# Patient Record
Sex: Female | Born: 1965 | Race: Black or African American | Hispanic: No | Smoking: Never smoker
Health system: Southern US, Community
[De-identification: ages and names within clinical notes are randomized; demographics above are authoritative.]

## PROBLEM LIST (undated history)

## (undated) ENCOUNTER — Ambulatory Visit (HOSPITAL_COMMUNITY): Source: Home / Self Care

## (undated) DIAGNOSIS — K219 Gastro-esophageal reflux disease without esophagitis: Secondary | ICD-10-CM

## (undated) DIAGNOSIS — R011 Cardiac murmur, unspecified: Secondary | ICD-10-CM

## (undated) DIAGNOSIS — T7840XA Allergy, unspecified, initial encounter: Secondary | ICD-10-CM

## (undated) DIAGNOSIS — D649 Anemia, unspecified: Secondary | ICD-10-CM

## (undated) HISTORY — DX: Gastro-esophageal reflux disease without esophagitis: K21.9

## (undated) HISTORY — DX: Cardiac murmur, unspecified: R01.1

## (undated) HISTORY — DX: Anemia, unspecified: D64.9

## (undated) HISTORY — PX: ABDOMINAL HYSTERECTOMY: SHX81

## (undated) HISTORY — PX: MYOMECTOMY: SHX85

## (undated) HISTORY — DX: Allergy, unspecified, initial encounter: T78.40XA

## (undated) HISTORY — PX: SEPTOPLASTY: SUR1290

## (undated) HISTORY — PX: UTERINE FIBROID SURGERY: SHX826

---

## 1998-05-01 ENCOUNTER — Ambulatory Visit (HOSPITAL_COMMUNITY): Admission: RE | Admit: 1998-05-01 | Discharge: 1998-05-01 | Payer: Self-pay | Admitting: *Deleted

## 1999-04-16 ENCOUNTER — Emergency Department (HOSPITAL_COMMUNITY): Admission: EM | Admit: 1999-04-16 | Discharge: 1999-04-16 | Payer: Self-pay | Admitting: Emergency Medicine

## 1999-07-03 ENCOUNTER — Other Ambulatory Visit: Admission: RE | Admit: 1999-07-03 | Discharge: 1999-07-03 | Payer: Self-pay | Admitting: Obstetrics and Gynecology

## 2000-09-20 ENCOUNTER — Other Ambulatory Visit: Admission: RE | Admit: 2000-09-20 | Discharge: 2000-09-20 | Payer: Self-pay | Admitting: *Deleted

## 2001-06-07 ENCOUNTER — Ambulatory Visit (HOSPITAL_COMMUNITY): Admission: RE | Admit: 2001-06-07 | Discharge: 2001-06-07 | Payer: Self-pay | Admitting: Obstetrics and Gynecology

## 2001-06-07 ENCOUNTER — Encounter: Payer: Self-pay | Admitting: Obstetrics and Gynecology

## 2001-07-05 ENCOUNTER — Encounter (INDEPENDENT_AMBULATORY_CARE_PROVIDER_SITE_OTHER): Payer: Self-pay

## 2001-07-05 ENCOUNTER — Inpatient Hospital Stay (HOSPITAL_COMMUNITY): Admission: RE | Admit: 2001-07-05 | Discharge: 2001-07-07 | Payer: Self-pay | Admitting: Obstetrics and Gynecology

## 2002-06-04 ENCOUNTER — Other Ambulatory Visit: Admission: RE | Admit: 2002-06-04 | Discharge: 2002-06-04 | Payer: Self-pay | Admitting: Obstetrics and Gynecology

## 2002-10-10 ENCOUNTER — Encounter (INDEPENDENT_AMBULATORY_CARE_PROVIDER_SITE_OTHER): Payer: Self-pay

## 2002-10-10 ENCOUNTER — Ambulatory Visit (HOSPITAL_COMMUNITY): Admission: RE | Admit: 2002-10-10 | Discharge: 2002-10-10 | Payer: Self-pay | Admitting: Obstetrics and Gynecology

## 2003-03-30 ENCOUNTER — Ambulatory Visit (HOSPITAL_COMMUNITY): Admission: RE | Admit: 2003-03-30 | Discharge: 2003-03-30 | Payer: Self-pay | Admitting: *Deleted

## 2003-03-30 ENCOUNTER — Encounter: Payer: Self-pay | Admitting: *Deleted

## 2003-06-25 ENCOUNTER — Other Ambulatory Visit: Admission: RE | Admit: 2003-06-25 | Discharge: 2003-06-25 | Payer: Self-pay | Admitting: Gynecology

## 2004-08-12 ENCOUNTER — Other Ambulatory Visit: Admission: RE | Admit: 2004-08-12 | Discharge: 2004-08-12 | Payer: Self-pay | Admitting: Gynecology

## 2004-11-06 ENCOUNTER — Observation Stay (HOSPITAL_COMMUNITY): Admission: RE | Admit: 2004-11-06 | Discharge: 2004-11-07 | Payer: Self-pay | Admitting: Gynecology

## 2004-12-11 ENCOUNTER — Encounter (INDEPENDENT_AMBULATORY_CARE_PROVIDER_SITE_OTHER): Payer: Self-pay | Admitting: *Deleted

## 2004-12-11 ENCOUNTER — Inpatient Hospital Stay (HOSPITAL_COMMUNITY): Admission: RE | Admit: 2004-12-11 | Discharge: 2004-12-14 | Payer: Self-pay | Admitting: Gynecology

## 2006-05-27 ENCOUNTER — Ambulatory Visit: Payer: Self-pay | Admitting: Gynecology

## 2006-12-26 ENCOUNTER — Ambulatory Visit: Payer: Self-pay | Admitting: Gynecology

## 2007-03-20 ENCOUNTER — Ambulatory Visit: Payer: Self-pay | Admitting: Gynecology

## 2007-09-21 ENCOUNTER — Ambulatory Visit: Payer: Self-pay | Admitting: Nurse Practitioner

## 2007-11-01 ENCOUNTER — Ambulatory Visit: Payer: Self-pay | Admitting: Gynecology

## 2008-01-30 ENCOUNTER — Ambulatory Visit: Payer: Self-pay | Admitting: Gynecology

## 2008-02-02 ENCOUNTER — Ambulatory Visit: Payer: Self-pay | Admitting: Gynecology

## 2010-07-02 ENCOUNTER — Encounter: Admission: RE | Admit: 2010-07-02 | Discharge: 2010-07-02 | Payer: Self-pay | Admitting: Obstetrics & Gynecology

## 2011-04-16 NOTE — Op Note (Signed)
[place] [facility]  Patient:    Renee Webb, Renee Webb                      MRN: [id number] Proc. Date: 07/05/01 Attending:  Cordelia Pen A. Rosalio Macadamia, M.D.                           Operative Report  PREOPERATIVE DIAGNOSES:       1. Menorrhagia.                               2. Fibroid uterus.                               3. Pelvic adhesions.  POSTOPERATIVE DIAGNOSES:      1. Menorrhagia.                               2. Fibroid uterus.                               3. Pelvic adhesions.  PROCEDURE:                    1. Multiple myomectomy.                               2. Lysis of adhesions.  SURGEON:                      Sherry A. Rosalio Macadamia, M.D.  ASSISTANT:                    Sung Amabile. Roslyn Smiling, M.D.  ANESTHESIA:                   General.  ESTIMATED BLOOD LOSS:         450 cc.  COMPLICATIONS:                None.  INDICATIONS:                  This is a 45 year old, G0, P0, woman who has very heavy and irregular bleeding for several years. The patients bleeding has gotten significantly worse recently. At the same time, the patient complained of left lower quadrant pain. The patient has not used any contraception recently but had been on birth control pills before without any improvement in her bleeding. The patient has been treated with Prometrium q.month to help with her bleeding, which improved slightly but not completely. The patient was followed with ultrasounds with multiple fibroids present. A hysterosalpingogram had been performed prior to surgery which showed at least one submucosal fibroid, but otherwise pretty normal fallopian tubes. There were adhesions in the cul-de-sac of the abdomen and around the fallopian tubes, which was felt to be consistent with some adhesions. Because of all of this, the patient was brought to the operating room for myomectomy and lysis of adhesions.  FINDINGS:                     An 77- to 12-week size,  irregularly-shaped uterus with multiple fibroids present. Approximately 15 fibroids removed of varying sizes. Normal fallopian  tubes bilaterally. Ovaries were normal, however, they were both stuck to the sidewalls of the pelvis with very fine adhesions surrounding the ovaries. Normal appendix.  DESCRIPTION OF PROCEDURE:     The patient was brought into the operating room and given adequate general anesthesia. She was placed in a dorsal lithotomy position. Her abdomen and pelvis were washed with Betadine. A Foley catheter was inserted into the bladder. Speculum was placed in the vagina. The anterior lip of the cervix was grasped with a single-tooth tenaculum. A pediatric Foley was inserted into the endometrial cavity. The balloon was inflated. The vagina was packed with three 4 x 18 Betadine-soaked sponges. Speculum and tenaculum were removed. The patient was taken out of the frog leg position. She was draped in a sterile fashion. A Pfannenstiel incision was made and brought down sharply through the fascia. The fascia was incised sharply. The fascia was elevated off of the rectus muscles sharply. The rectus muscles were separated bluntly. The peritoneum was identified and entered sharply. This incision was extended superiorly and inferiorly. Balfour retractor was placed in the abdominal cavity. Bowel was packed off with wrapped sponges. The uterus was inspected with multiple fibroids present. The fibroids were removed by injecting first with Pitressin, making an incision with the needle tip Bovie, and the fibroids were dissected with blunt and sharp dissection as well as cautery dissection. Through each incision, as many fibroids were removed as possible. Approximately four different incisions were made throughout the uterus to be able to remove all the fibroids. Each incision was closed deeply with 0 Vicryl in figure-of-eight stitches in multiple layers, and each serosal incision was  closed with 2-0 Monocryl in baseball-type stitches. There were two pedunculated fibroids that were removed by dissecting them free from the serosa and the serosa was just closed with the 2-0 Monocryl. One fibroid was left in place. This was just beneath the left cornual region. It was felt that if this fibroid was removed, some damage to the fallopian tube and cornua might ensue. All of the fibroids were removed that could be palpated. The indigo carmine dye had been injected through the cervix, however, at no time could any free spill be seen through the fallopian tubes. At no time was the endometrial cavity breached; however, because of the nature of the multiple incisions throughout the uterus, it is our recommendation that should this pat become pregnant, she should have a cesarean section in the future.  Attention was turned to the ovaries. The ovaries had been stuck to the sidewalls. These were dissected free with just blunt dissection, and the surface of each ovary was cauterized and all the adhesions were lysed carefully by using the [employer] unit. Any bleeders were cauterized by using the microscopic bipolar cautery. The area was irrigated during all of this dissection. The fallopian tubes were felt to be normal. The pelvis was felt to be free after this dissection had been performed. The incision was irrigated with large amounts of warm saline. The packed sponges were removed. Using Interceed, this was placed across the anterior wall of the uterus to protect the incisions to keep any adhesions from forming. The large colon was placed against the uterus to prevent any small obstruction. The appendix was felt to be normal and it was placed under the cecum above the uterus and to the right in the normal position. Adequate hemostasis was present. All packs and instruments were removed from the abdomen. The fascia was then closed with two #0  Vicryl sutures running laterally to the  midline. The incision was irrigated. The bleeders were cauterized. The skin was infiltrated with 0.25% Marcaine. The skin incision was closed with 4-0 Monocryl in subcuticular  running stitch. Sterile bandage was placed over the wound. The packs and pediatric Foley were removed from the vagina. The patient was taken out of the frog leg position. She was then moved from the operating table to a stretcher in stable condition. DD:  07/05/01 TD:  07/06/01 Job: [id number]

## 2011-04-16 NOTE — Discharge Summary (Signed)
NAMEBENJAMIN, Renee Webb             ACCOUNT NO.:  [id number]   MEDICAL RECORD NO.:  [id number]          PATIENT TYPE:  INP   LOCATION:  9320                          FACILITY:  [facility]   PHYSICIAN:  Ginger Carne, MD  DATE OF BIRTH:  1966/03/07   DATE OF ADMISSION:  12/11/2004  DATE OF DISCHARGE:  12/13/2004                                 DISCHARGE SUMMARY   FINAL DIAGNOSIS:  A 14 to 16-week leiomyomatous uterus.   PROCEDURE:  Myomectomy converted to total abdominal hysterectomy with  preservation of both tubes and ovaries and placement of ureteral stents  bilaterally.   HOSPITAL COURSE:  This is a 45 year old African American female with a  complicated history prior to admission secondary to multiple procedures  related to leiomyomatous uterus at other centers.  At the time of surgery,  the patient and husband understood that if the uterus would not be capable  of satisfactory closure due to significant leiomyomata being removed and to  take care of persistent left lower quadrant pain related to fibroids,  hysterectomy will be performed.  At the time of surgery, it was evident that  the uterus could not be preserved satisfactorily due to the above criteria.  The patient's husband was notified intraoperatively, and a total abdominal  hysterectomy was therefore performed.   The patient's postoperative course was uneventful.  She was afebrile, voided  well, and her incision was dry.  Calfs were without tenderness, and her  lungs were clear.  Routine postoperative instructions were provided to said  patient.  She will be rechecked in four days to have her subcuticular suture  removed.  Percocet 5/235 one to two every four to six hours provided to said  patient.  Postoperative instructions also included concerns related to depression and  the possibility of contacting the office should she feel this way following  her surgery.   Postoperative hemoglobin was 8.3 from a preoperative of  10.7.     Stev   SHB/MEDQ  D:  12/13/2004  T:  12/13/2004  Job:  [id number]

## 2011-04-16 NOTE — Op Note (Signed)
NAME:  Renee Webb, Renee Webb                       ACCOUNT NO.:  [id number]   MEDICAL RECORD NO.:  [id number]                   PATIENT TYPE:  AMB   LOCATION:  SDC                                  FACILITY:  [facility]   PHYSICIAN:  Dois Davenport A. Rivard, M.D.              DATE OF BIRTH:  05-11-1966   DATE OF PROCEDURE:  10/10/2002  DATE OF DISCHARGE:                                 OPERATIVE REPORT   PREOPERATIVE DIAGNOSES:  1. Left lower quadrant pain.  2. Infertility.  3. Fibroids.   POSTOPERATIVE DIAGNOSES:  1. Pelvic adhesions.  2. Left ovarian cyst.  3. Endometriosis.  4. Fibroids.  5. Bilateral tubal patency.   PROCEDURE:  Laparoscopy, lysis of adhesions, left ovarian cystectomy,  chromopertubation.   SURGEON:  Crist Fat. Rivard, M.D.   ANESTHESIA:  General.   ESTIMATED BLOOD LOSS:  Minimal.   DESCRIPTION OF PROCEDURE:  After being informed of the planned procedure  with possible complications including bleeding, infection, injury to other  organs such as bladder, bowels,potassium or ureters and the need for  laparotomy.  Informed consent was obtained.  The patient was taken to OR #2,  given general anesthesia and endotracheal intubation and placed in a  lithotomy position.  She was prepped and draped in a sterile fashion.  A  Foley catheter was inserted in her bladder, and an acorn manipulator was  inserted in her uterus for chromopertubation.  We proceeded with  infiltration of the umbilical area using 8 cc of Marcaine 0.25%, and  performed a semi-elliptical incision using knives.  This was bluntly brought  down to the fascia which was grasped with two Kocher forceps and incised  with Mayo scissors.  A 0 Vicryl suture was placed on each angle of the  fascia.  The peritoneum was then entered bluntly.  A Hasson trocar was  placed and held in place with the two previously placed 0 Vicryl sutures.  We proceeded with insufflation of the pneumoperitoneum using CO2 at a  maximum  pressure of 15 mmHg and inserted the laparoscope mounted on the  video monitor.   Observation:  The anterior cul-de-sac shows fine adhesions between the  bladder and the uterus as well as 1 cm brown plaque compatible with  endometriosis.  The uterus is enlarged and has a left cornual subserosal  fibroid measuring 3-4 cm and overall volume is compatible with 8-10 weeks  pregnancy.  The posterior cul-de-sac is partially obliterated with fine  adhesions pulling on the sigmoid colon.  The left adnexa is completely  adherent to the left pelvic wall due to a significant adhesion process.  The  right adnexa is adherent to the right pelvic wall again with significant  adhesions.  It is impossible to fully evaluate tubes at this point due to  the distortion of the anatomy.  The appendix is visualized and normal.  The  liver is visualized and normal.  No  other pathology is seen within the  abdominal pelvic cavity.   We proceeded with the insertion of a 5 mm trocar in the left lower quadrant  and a 10 mm trocar in the suprapubic area overlooking the previous  Pfannenstiel incisions after infiltration with an extra 10 cc of Marcaine  0.25%.  This allows [place] systemic complete dissection of all adhesions using  scissors and tripolar.  We are now able to completely free the left ovary  and the left tube as well as the right ovary and the right tube and the  bladder flap.  The endometriotic lesion previously described is also  cauterized.  When both ovaries are freed we can see that there is a small 2-  3 cm cyst on the left ovary which spontaneously ruptures during  adhesiolysis.  It leaks clear fluid.  Part of the ovarian cyst wall is  removed and sent for pathology.  We then completely cauterized the capsule  of the cyst.  We abundantly irrigated with warm saline,a nondistended  complete hemostasis on the uterine surface with cautery.  Hemostasis was  then felt to be adequate.  We proceeded with  chromopertubation which reveals  bilateral tubal patency.  We again irrigate profusely with warm saline, note  a satisfactory hemostasis.  We placed a full sheet of Interceed over the  left ovary and left tube.  The two extra trocars are then removed under  direct visualize to appreciate complete hemostasis.  The laparoscope is  removed.  Pneumoperitoneum is evacuated.  We then closed the fascia of both  10 mm incisions using simple sutures of 0 Vicryl.  Skin is closed with 4-0  Vicryl.  Instrument and sponge counts were complete x2.  Estimated blood  loss is minimal.  The procedure was very well tolerated by the patient who  was taken to the recovery room in a well and stable condition.                                               Crist Fat Rivard, M.D.    SAR/MEDQ  D:  10/10/2002  T:  10/10/2002  Job:  [id number]

## 2011-04-16 NOTE — Discharge Summary (Signed)
[facility]  Patient:    Renee Webb, Renee Webb Visit Number: [id number] MRN: [id number]          Service Type: GYN Location: [id number] Attending Physician:  Morene Antu Dictated by:   Sherry A. Rosalio Macadamia, M.D. Admit Date:  07/05/2001 Discharge Date: 07/07/2001                             Discharge Summary  PROBLEM #1:                   Menorrhagia, fibroid uterus.  SUBJECTIVE:                   The patient is a 45 year old, G0, P0, woman who has excessively heavy menstrual flow. The patient has had known fibroids for several years. The patient had a hysterosalpingogram performed prior to surgery which revealed loculation of some dye in her cul-de-sac. The patient had an ultrasound showing multiple fibroids. Because of this, the patient is brought to the operating room for exploratory laparotomy with myomectomy.  OBJECTIVE:  PHYSICAL EXAMINATION:         HEENT within normal limits. Neck without any lymphadenopathy. Thyroid without nodule. Chest clear to auscultation. Heart regular rhythm without murmur. Breasts without mass. CVA nontender. Abdomen soft, nontender, without mass. External genitalia within normal limits. Cervix within normal limits. Uterus anteflexed, irregular, 10- to 11-week size. Adnexa without mass.  HOSPITAL COURSE:              The patient was admitted and brought to the operating room where multiple myomectomies and lysis of adhesions were performed.  Findings included a 12-week size uterus with multiple fibroids present ranging in size from 1 cm to 5 cm. Approximately 15 were removed. Normal tubes, normal fimbria. Ovaries with filmy adhesions across the surface and stuck to the sidewalls. Normal appendix.  Postoperatively, the patient did well. She remained afebrile. Her vital signs remained stable. The patients Tmax was 100.1. The patients preoperative hematocrit was 34.1 and on her first postoperative day  was 27.7. Urinalysis was within normal limits preoperatively. The patient was discharged to home on her second postoperative day.  ASSESSMENT:                   Stable, status post multiple myomectomy and lysis of adhesions.  PLAN:                         The patient is to follow up in the office in two to three weeks. The patient will call if she has heavy bleeding, fevers, or severe pain.  DISCHARGE MEDICATIONS:        The patient is discharged home on Darvocet-N 100, #30.Dictated by:   Sherry A. Rosalio Macadamia, M.D. Attending Physician:  Morene Antu DD:  08/06/01 TD:  08/06/01 Job: [id number]

## 2011-04-16 NOTE — H&P (Signed)
Renee Webb, Renee Webb             ACCOUNT NO.:  [id number]   MEDICAL RECORD NO.:  [id number]          PATIENT TYPE:  OBV   LOCATION:  NA                            [facility]AC[facility]:  [facility]   PHYSICIAN:  Ginger Carne, MD  DATE OF BIRTH:  12/10/1965   DATE OF ADMISSION:  11/05/2004  DATE OF DISCHARGE:                                HISTORY & PHYSICAL   ADMISSION DIAGNOSES:  1.  Pelvic adhesions.  2.  Leiomyomata of uterus.  3.  Genuine urinary stress incontinence.   PROCEDURE:  1.  Tension-free vaginal tape procedure with polypropylene and cystoscopy.  2.  Operative laparoscopy.  3.  Operative hysteroscopy with chromotubation.   HISTORY OF PRESENT ILLNESS:  This patient is a 45 year old, gravida 0, para  0, African American female with a longstanding history of infertility,  leiomyomata, and urodynamic stress incontinence.   The patient had been followed by a gynecologist in [place] with long-term  infertility complaints.  Her menses have been regular about every 28 to 30  days.  She has been diagnosed over the years with leiomyomata and had  undergone a laparoscopic myomectomy in 2003, proceeded by a laparotomy for  leiomyomata removal in 2002.  In 2004, she had a laparotomy for adhesion of  lysis.   The patient had undergone in July of 2004 a histosalpingogram at  [facility] which concluded large filling defects within the  endometrial cavity suspicious for submucosal fibroids measuring about 3.5 to  4 cm in dimension.  At that time of her initial visit to this office, the  patient had been seeking a second opinion because of said genuine urinary  stress incontinence but also because of concerns related to multiple  myomectomies and her inability to conceive.  Her husband to date has not had  a semen analysis.  However, on most recent history of salpingogram in 2004,  both fallopian tubes appeared to be within normal limits with bilateral  intraperitoneal  spillage.   The patient states that she does have discomfort with menses and does have  complaints of dyspareunia from time to time.  She does not have specific  pelvic pain.   Symptoms of genuine urinary stress incontinence includes loss of urine with  straining, coughing, other Valsalva maneuvers.  She denies increased  frequency, nocturia, urgency, dysuria, and pressure.  She does have to  strain to void.  Does not have an intermittent stream.  She has not had  previous urological surgery or recurrent pyelonephritis/urinary tract  infections.  She has not had previous bladder surgery and denies fecal  incontinence.  She takes no medications to enhance her loss of urine and has  no known medical diseases that would increase her propensity for urine loss.   OBSTETRICAL HISTORY:  The patient has never conceived.   ALLERGIES:  DYE and TRIAMINIC COUGH SYRUP.   MEDICATIONS:  Allegra D and Singulair.   PAST MEDICAL HISTORY:  Asthma.   FAMILY HISTORY:  The patient's mother is deceased from colon cancer.  Her  father has hypertension.  No other family first degree relatives with  breast,  ovary, or uterine carcinoma.   SOCIAL HISTORY:  Negative for smoking, illicit drug abuse, or alcohol abuse.   PAST SURGICAL HISTORY:  Per above.   REVIEW OF SYMPTOMS:  Negative.   PHYSICAL EXAMINATION:  VITAL SIGNS:  Height 5 foot 11 inches.  Blood  pressure 128/60.  Weight 180 pounds.  HEENT:  Grossly normal.  BREASTS:  Without masses, discharge, thickening, or tenderness.  CHEST:  Clear to auscultation and percussion.  CARDIOVASCULAR:  Without murmurs or enlargements.  Regular rate and rhythm.  EXTREMITIES:  All within normal limits.  LYMPHATICS:  All within normal limits.  SKIN:  All within normal limits.  NEUROLOGICAL:  All within normal limits.  MUSCULOSKELETAL:  All within normal limits.  ABDOMEN:  Soft without gross hepatosplenomegaly.  PELVIC:  Normal Pap smear.  External genitalia,  vulva, and vagina normal.  Cervix smooth without erosions or lesions.  Uterus is multilobular about 8  to 10 weeks in size, semimobile consistent with leiomyomatous uterus.  Both  adnexal are palpable and found to be normal.  RECTAL:  Hemoccult negative without masses.   LABORATORY DATA:  Urinalysis is normal.  Residual urine volume is 26 cc.  Perineal reflex is normal.  S2/3/4 reflexes normal.  No specific evidence  for cystocele, rectocele, or antrocele.  Anal sphincter tone is normal.  The  patient has a Q-tip angle of 40 degrees without hypermobility.   IMPRESSION:  1.  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  2.  Genuine urinary stress incontinence.   PLAN:  Long-term infertility:  I discussed with Mr. and Mrs. Tripp that  what is needed at this time is careful evaluation and assessment of her  pelvis and uterus in so far as infertility is concerned.  She is need in of  an operative hysteroscopy and chromotubation to analyze and evaluate and  treat leiomyoma.  It was carefully discussed with said couple that on  balance most evidence indicates that pregnancy and implantation rates are  significantly lower in women with submucosal leiomyoma; however, in those  with subserosal or intramural myoma that do not encroach or significantly  distort the endometrial cavity, it is unlikely that these lesions will have  any significant impact on fertility rates.  Also, the patient needs to be  evaluated as far as adhesive disease in her pelvis.  It is unclear from a  previous operative record as to what extent the adhesions are effecting her  adnexa.   The patient has attempted Kegel exercises in the past without benefit.  She  has no evidence for an overactive bladder. The nature of said procedure  discussed in detail, specifically risks including postoperative urinary retention, urgency, urinary infection, bleeding, wound infection of the  vaginal site, and  possible recurrent urinary incontinence.  The latter was  discussed in detail as to her possibilities of conception in the future.  It  was recommended that an elective cesarean section be performed in the event  of a full term pregnancy.  The nature of both procedures discussed in  detail.  All questions answered.  She is scheduled for said surgery on  Friday, November 06, 2004.     Stev   SHB/MEDQ  D:  11/02/2004  T:  11/02/2004  Job:  [id number]

## 2011-04-16 NOTE — Discharge Summary (Signed)
NAMEMALI, EPPARD             ACCOUNT NO.:  [id number]   MEDICAL RECORD NO.:  [id number]          PATIENT TYPE:  OBV   LOCATION:  9305                          [facility]ILITY[facility]   PHYSICIAN:  Ginger Carne, MD  DATE OF BIRTH:  1966-02-08   DATE OF ADMISSION:  11/06/2004  DATE OF DISCHARGE:                                 DISCHARGE SUMMARY   FINAL DIAGNOSES:  Fourteen week leiomyomatous uterus and urodynamic stress  incontinence.   HOSPITAL PROCEDURES:  Diagnostic laparoscopy, chromotubation, tension-free  vaginal tape procedure and cystoscopy with hysteroscopy.   HOSPITAL COURSE:  This patient is a 45 year old [place] American female who  underwent the aforementioned procedures on November 06, 2004.  Specifically,  the patient was noted to have a 14-week leiomyomatous uterus with tubes that  did not show a bilateral spillage of dye.  The patient was able to void on  discharge.  Cystoscopy was normal immediately after surgery.  She was  discharged with routine postoperative instructions.   DISCHARGE MEDICATIONS:  Percocet 5/325 1-2 q.4-6h. and Augmentin 500 mg  b.i.d.   DISCHARGE INSTRUCTIONS:  She was instructed to have timed voids over the  next 1-2 weeks.  Will contact the office for difficulties with voiding,  temperature elevation above 100.4 degrees Fahrenheit, increasing vaginal  discharge or bleeding or any other concerns.  The patient was asked to avoid  constipation.   FOLLOWUP:  Will be seen in the office in 2 weeks time.     Stev   SHB/MEDQ  D:  11/07/2004  T:  11/07/2004  Job:  [id number]

## 2011-04-16 NOTE — H&P (Signed)
NAMEARNELLE, NALE             ACCOUNT NO.:  [id number]   MEDICAL RECORD NO.:  [id number]          PATIENT TYPE:  INP   LOCATION:  NA                            [facility]   PHYSICIAN:  Ginger Carne, MD  DATE OF BIRTH:  25-Jul-1966   DATE OF ADMISSION:  DATE OF DISCHARGE:                                HISTORY & PHYSICAL   ADMISSION DIAGNOSIS:  Uterine leiomyoma.   PROCEDURE:  Myomectomy, possible total abdominal hysterectomy with  preservation of both tubes and ovaries and placement of ureteral stents with  cystoscopy.   HISTORY OF PRESENT ILLNESS:  This patient is a 45 year old African American,  gravida 0, para 0 female with a longstanding history of infertility,  leiomyoma and pelvic pain.  The patient had been seen in the past by several  gynecologists in [place], [place], with long-term infertility  complaints.  Her menses are regular, about every 28 to 30 days.  Over the  years, she has been diagnosed with leiomyoma which had been removed via  laparoscopic myomectomy in 2003 followed by laparotomy for said lesions in  2002.  In 2004, she had another laparotomy for lysis of adhesions.  In July  of 2004, hysterosalpingogram at [facility] indicated large  filling defects within the endometrial cavity suspicious for submucosal  fibroids measuring 3.5 to 4 cm.  At the time of her visit to this office  initially, the patient had sought a second opinion and subsequently  underwent, on November 06, 2004, a diagnostic laparoscopy with  chromopertubation, hysteroscopy and tension free vaginal tape.  Procedure  was cystoscopy for infertility and for genuine urinary stress incontinence,  respectively.  At the time of this procedure, there were multiple intramural  and subserosal as well as submucosal leiomyoma.  Many were located in the  left lateral aspect of her uterus where her pain is principally located.  There was no evidence of dye spillage  bilaterally despite multiple attempts  at chromopertubation with methylene blue dye.  The pelvis did not  demonstrate evidence for endometriosis.  The remainder of the upper abdomen,  large and small-bowel were grossly normal.  Total uterine size was measured  to be approximately 14 cm.   The patient states that she does have discomfort with her menses and between  her menses as well as dyspareunia.  She does complain of left lower quadrant  pain as well.   OBSTETRIC/GYNECOLOGIC HISTORY:  The patient has never conceived.   ALLERGIES:  DYE to [facility] COUGH SYRUP.   MEDICATIONS:  Allegra and Singulair.   PAST MEDICAL HISTORY:  Asthma.   FAMILY HISTORY:  The patient's mother is deceased from colon cancer.  Her  father has hypertension.  No other first degree relatives with breast,  colon, ovarian or uterine carcinoma.   SOCIAL HISTORY:  Negative for smoking, illicit drug abuse or alcohol abuse.   PAST SURGICAL HISTORY:  Per above.   REVIEW OF SYMPTOMS:  Negative.   PHYSICAL EXAMINATION:  GENERAL APPEARANCE:  A pleasant female in no acute  distress.  VITAL SIGNS:  Weight 180 pounds, blood pressure  120/70, height 5 feet 11  inches.  HEENT:  Grossly normal.  CHEST:  Clear.  LUNGS:  Clear to percussion and auscultation.  CARDIOVASCULAR:  Without murmurs or enlargements.  BREASTS:  Without masses, discharge, thickening or tenderness.  VASCULAR/EXTREMITIES/LYMPHATICS/SKIN/NEUROLOGIC/MUSCULOSKELETAL:  All within  normal limits.  ABDOMEN:  A 14-week size lower abdominal mass consistent with leiomyoma.  PELVIC:  Normal Pap smear.  External genitalia, vulva and vagina normal.  Cervix smooth without erosions or lesions.  Uterus is 14 weeks in size,  multinodular.  Both adnexa are palpable and are filled with masses  consistent with leiomyoma.  RECTAL:  Hemoccult negative without masses.   IMPRESSION:  A 14-week leiomyomatous uterus with concerns of infertility as  well as pelvic  pain.   PLAN:  I discussed at length with the patient the options of having to deal  with a reoperated pelvis and the possibility of further adhesion formation.  In particular, concerns regarding the possibility of having a significantly  deformed uterus may result in a total abdominal hysterectomy after removing  said fibroids.  The patient's pain on her left side according to her and her  husband are significant and both parties feel that the pain should take full  priority over infertility.  I also explained to them that performing a  myomectomy does not assure fertility.  In addition, the tubes will undergo  chromopertubation at the time of said myomectomy to see if patency can be  accomplished.  Risks including hemorrhage possibly requiring blood  transfusion, infection, postoperative adhesion formation and possible  hysterectomy resulting from increased blood loss and/or significant  deformity of the uterus were discussed in detail with the patient.  Natures  and procedures also discussed.     Stev   SHB/MEDQ  D:  12/08/2004  T:  12/08/2004  Job:  [id number]

## 2011-04-16 NOTE — Discharge Summary (Signed)
NAMEANDRIENNE, HAVENER             ACCOUNT NO.:  [id number]   MEDICAL RECORD NO.:  [id number]          PATIENT TYPE:  INP   LOCATION:  9320                          [facility]:  [facility]   PHYSICIAN:  Ginger Carne, MD  DATE OF BIRTH:  01/27/1966   DATE OF ADMISSION:  12/11/2004  DATE OF DISCHARGE:  12/14/2004                                 DISCHARGE SUMMARY   ADDENDUM   Following discharge instructions and discharge orders on the morning of  December 13, 2004, the patient did not have a bowel movement despite being  placed on two Bisacodyl 10 mg tablets along with a Dulcolax suppository.  On  evaluation in the late afternoon, her abdomen was slightly distended with  minimal to no bowel sounds and concern regarding the possibility of ileus  was entertained.  She was placed NPO and intravenous hydration will be  started and the patient was observed.  Her potassium was 3.2 and hemoglobin  7.9.  The patient had a bowel movement on the morning of discharge.  Bowel  sounds were adequate.  Abdomen was soft and the patient felt subjectively  better.  She was discharged with encouragement to have oranges, increase her  potassium level and make sure she keeps up with her fluids and does not eat  heavy red meats for the next week.  Otherwise, routine postoperative  instructions as described in my previous dictation.     Stev   SHB/MEDQ  D:  12/14/2004  T:  12/14/2004  Job:  [id number]

## 2011-04-16 NOTE — Op Note (Signed)
[name], Webb             ACCOUNT NO.:  [id number]   MEDICAL RECORD NO.:  [id number]          PATIENT TYPE:  OBV   LOCATION:  9399                          [facility]  [facility]   PHYSICIAN:  Ginger Carne, MD  DATE OF BIRTH:  [birth year]   DATE OF PROCEDURE:  11/06/2004  DATE OF DISCHARGE:                                 OPERATIVE REPORT   PREOPERATIVE DIAGNOSES:  1.  Pelvic adhesions.  2.  Leiomyomatous uterus.  3.  Genuine urinary stress incontinence.   POSTOPERATIVE DIAGNOSES:  1.  A  14-week leiomyomatous uterus.  2.  Genuine urinary stress incontinence.   PROCEDURE:  1.  Diagnostic laparoscopy.  2.  Chromotubation.  3.  Hysteroscopy.  4.  Tension-free vaginal tape procedure with cystoscopy.   SURGEON:  Ginger Carne, M.D.   ASSISTANT:  None.   COMPLICATIONS:  None immediate.   ESTIMATED BLOOD LOSS:  250 mL.   SPECIMENS:  None.   OPERATIVE FINDINGS:  External genitalia, vulva, and vagina are normal.  Cervix is smooth without erosions or lesions.  Hysteroscopic evaluation  revealed a normal endocervical canal.  Advancement of hysteroscope revealed  multiple submucosal leiomyomata with significant distortion of the uterine  cavity.  The uterus sounded to 14 cm.  Laparoscopic evaluation demonstrated  multiple intramural and subserosal leiomyoma.  Many of these were located in  the fundus and on the left lateral and right lateral sides of the uterus.  There was no evidence of dye spillage bilaterally despite multiple attempts  and chromotubation with methylene blue dye.  Pelvis demonstrated no evidence  of endometriosis.  Upper abdomen appeared normal.  Large and small bowel  normal.  No evidence of femoral, inguinal, obturator hernias.   OPERATIVE PROCEDURE:  The patient prepped and draped in the usual fashion  and placed in the lithotomy position.  Betadine solution used for antiseptic  and the patient was catheterized prior to the procedure.  After  adequate  general anesthesia, a vertical and infra-umbilical incision was made and the  Veress needle placed in the abdomen.  The opening and closing pressures were  10 and 15 mmHg.  The needle was released and a trocar was placed in the same  incision.  The laparoscopic placed through the sleeve.  One left lower  quadrant port 5 mm was made in the left lower quadrant.  This was performed  under direct visualization.  Following this, inspection of the pelvic  contents was followed by photography and this was the followed by  chromotubation as described above.  Afterwards, gas released, trocars  removed.  Closure of the 10 mm fascial site with 0 Vicryl suture and 4-0  Vicryl for subcuticular closure.  Afterwards a hysteroscopic evaluation was  performed by grasping the cervix with a single-tooth tenaculum.  Dilatation  to accommodate a hysteroscope with normal saline was then followed by  completion of said procedure without difficulties.  At this point, the  tension-free vaginal tape procedure was performed.   Copious irrigation of the vaginal with lactated Ringer's followed.  The sub-  epithelial layer of the anterior vagina epithelium  was injected with  Marcaine and 0.5% epinephrine.  This was then followed by a 4 cm incision on  the anterior vaginal epithelium midline.  The pubovesical cervical fascia  was dissected bilaterally to the point of the space of Retzius bilaterally.  At this point, the tension-free vaginal tape system with polypropylene and  bottom up trocars were used.  The trocars exited the lower abdominal region  hugging the back of the pubic bone bilaterally at the level of the pubic  tubercles.  Cystoscopy revealed no injury to the bladder with filling to the  dome.  Sidewalls, the ureteral orifices identified and trigone was  identified.  Afterwards the bladder was emptied.  Appropriate tensioning to  250 mL of normal saline in the bladder was performed.  At this  point, the  plastic sheaths were removed from the TVT system and the TVT was cut  appropriately at the level of the lower abdominal skin region.  Bleeding  points were hemostatically set with copious irrigation and lactated  followed.  Closure of the vaginal epithelium with 0 Monocryl, running,  interlocking suture.  The patient tolerated the procedure well and was  transported to anesthesia recovery room in excellent condition.     Stev   SHB/MEDQ  D:  11/06/2004  T:  11/07/2004  Job:  [id number]

## 2011-04-16 NOTE — Op Note (Signed)
Renee Webb, Renee Webb             ACCOUNT NO.:  [id number]   MEDICAL RECORD NO.:  [id number]          PATIENT TYPE:  INP   LOCATION:  9320                          [facility]   PHYSICIAN:  Ginger Carne, MD  DATE OF BIRTH:  1966/11/11   DATE OF PROCEDURE:  12/11/2004  DATE OF DISCHARGE:                                 OPERATIVE REPORT   PREOPERATIVE DIAGNOSIS:  Fourteen to sixteen-week leiomyomatous uterus.   POSTOPERATIVE DIAGNOSIS:  Fourteen to sixteen-week leiomyomatous uterus.   PROCEDURES:  1.  Myomectomy conversion to total abdominal hysterectomy.  2.  Cystoscopy with placement of ureteral stents.   SURGEON:  Ginger Carne, MD   ASSISTANT:  None.   COMPLICATIONS:  None immediate.   ESTIMATED BLOOD LOSS:  350 mL.   SPECIMENS:  Uterus and cervix.   ANESTHESIA:  General.   OPERATIVE FINDINGS:  The uterus was about 16 weeks in size and had multiple  leiomyoma, subserosal and intramural, areas of concentrated both anteriorly,  posteriorly, and laterally.  Many of these measured 3-4 cm, and others were  small between 8 mm and 2 mm.  During the course of the dissection, it was  obvious that despite all best efforts, the intrauterine cavity was entered  anteriorly for approximately 7-8 cm with added fibroids that needed to be  removed to further distort the uterus.  There were also fibroids on the left  side at the isthmus.  Discussions with the husband and brother and patient  prior to surgery addressed the concerns that the patient's primary issues of  pain were overriding and took priority over fertility.  It was felt that  with further dissection, the uterus would not be capable of suitable chances  for fertility and that said lesions also invaded the ostia of said tubes  bilaterally as well.  For this reason, it was deemed that to have left  additional fibroids anteriorly and posteriorly on the left side where most  of her pain was concentrated would not  be appropriate.  Both tubes and  ovaries appeared to be normal.  They were somewhat adherent by scar tissue  to the lateral aspects of the uterus.  The right tube was adherent to the  isthmus.  The ureters were identified bilaterally with the aid of stents.  Number 6 stents were used.  The bladder appeared normal.   OPERATIVE PROCEDURE:  Patient prepped and draped in the usual fashion and  placed in lithotomy position, Betadine solution used for antiseptic, and the  patient was catheterized prior to the procedure.  After adequate general  anesthesia, a cystoscopy was performed by placing a 25-degree cystoscope  with normal saline in the bladder through the urethra.  The ureteral  orifices were catheterized without incident with #6 French catheters.  Following this, a Pfannenstiel incision was made and the abdomen opened.  A  self-retaining retractor was utilized.  The dilute solution of Pitressin was  also used, 20 units in 100 mL of normal saline, around leiomyoma.  Bleeding  was not a major problem during the attempt at myomectomy.  When it  was  determined that the uterus had been significantly distorted and all myoma  had not been removed, the patient's husband was notified by telephone in the  operating room.  The case was discussed, and it was agreed to perform a  total abdominal hysterectomy with preservation of both tubes and ovaries.  The round ligaments were clamped, cut and ligated with 0 Vicryl suture.  The  utero-ovarian ligaments were clamped, cut and ligated with 0 Vicryl suture.  Anterior and posterior peritoneal leaves dissected, and the uterine  vasculature was clamped, cut and ligated with 0 Vicryl suture.  At the  junction of the vagina and the cervix, the uterus and the cervix were  removed and the cuff closed with 0 Vicryl running interlocking suture.  Bleeding points hemostatically checked, blood clots removed from the  abdomen.  Copious irrigation with lactated  Ringer's followed.  Closure of  the parietal peritoneum with 2-0 Vicryl running suture, 0 Vicryl for the  fascia, 3-0 Vicryl for the subcutaneous layer, and 4-0 Prolene for  subcuticular closure.  Instrument and sponge count were correct.  The  patient tolerated the procedure well, returned to the postanesthesia  recovery room in excellent condition.     Stev   SHB/MEDQ  D:  12/11/2004  T:  12/11/2004  Job:  [id number]

## 2013-04-21 ENCOUNTER — Ambulatory Visit: Payer: Self-pay | Admitting: Physician Assistant

## 2013-04-21 VITALS — BP 117/77 | HR 66 | Temp 97.6°F | Resp 18 | Ht 69.0 in | Wt 185.0 lb

## 2013-04-21 DIAGNOSIS — K6289 Other specified diseases of anus and rectum: Secondary | ICD-10-CM

## 2013-04-21 DIAGNOSIS — J302 Other seasonal allergic rhinitis: Secondary | ICD-10-CM

## 2013-04-21 DIAGNOSIS — K645 Perianal venous thrombosis: Secondary | ICD-10-CM

## 2013-04-21 NOTE — Progress Notes (Signed)
   [address] [place] [address]   Phone [phone]  Subjective:    Patient ID: Renee Webb, female    DOB: March 13, 1966, 47 y.o.   MRN: [id number]  HPI Pt presents to clinic with concerns that she has hemorrhoids.  She started having pain in her anal area on Wed and it has gotten worse.  It hurts to sit and stand.  She has been using tucks pads and they are not helping much.  She has never had hemorrhoids before that she knows of.  She has been seeing blood on the toilet tissue.  She has not recently been constipated.   Review of Systems  Gastrointestinal: Positive for diarrhea (about 2 wks ago) and anal bleeding (on toilet tissue). Negative for constipation and blood in stool.       Objective:   Physical Exam  Vitals reviewed. Constitutional: She is oriented to person, place, and time. She appears well-developed and well-nourished.  HENT:  Head: Normocephalic and atraumatic.  Right Ear: External ear normal.  Left Ear: External ear normal.  Pulmonary/Chest: Effort normal.  Abdominal: Soft.  Genitourinary: Rectal exam shows external hemorrhoid (2 large (about 1.5cm each) thrombosed hemorrhoids - ).  Neurological: She is alert and oriented to person, place, and time.  Skin: Skin is warm and dry.  Psychiatric: She has a normal mood and affect. Her behavior is normal. Judgment and thought content normal.    Procedure:  Consent obtained.  Local anesthesia obtained with 1% lido with epi.  Curved iris scissors used to make an elliptical incision into each thrombosed hemorrhoid.  Multiple blood clots from both were removed.  Pt got immediate relief.      Assessment & Plan:  Thrombosed hemorrhoids- treated with enucleation of blood clots.  Pt to do warm sitz baths and make sure the area is clean after bowel movements.  Anal pain  Benny Lennert PA-C 04/21/2013 1:14 PM

## 2013-04-21 NOTE — Patient Instructions (Addendum)
Hemorrhoids -- thrombosed hemorrhoid (up to date- website) Hemorrhoids are swollen veins around the rectum or anus. There are two types of hemorrhoids:   Internal hemorrhoids. These occur in the veins just inside the rectum. They may poke through to the outside and become irritated and painful.  External hemorrhoids. These occur in the veins outside the anus and can be felt as a painful swelling or hard lump near the anus. CAUSES  Pregnancy.   Obesity.   Constipation or diarrhea.   Straining to have a bowel movement.   Sitting for long periods on the toilet.  Heavy lifting or other activity that caused you to strain.  Anal intercourse. SYMPTOMS   Pain.   Anal itching or irritation.   Rectal bleeding.   Fecal leakage.   Anal swelling.   One or more lumps around the anus.  DIAGNOSIS  Your caregiver may be able to diagnose hemorrhoids by visual examination. Other examinations or tests that may be performed include:   Examination of the rectal area with a gloved hand (digital rectal exam).   Examination of anal canal using a small tube (scope).   A blood test if you have lost a significant amount of blood.  A test to look inside the colon (sigmoidoscopy or colonoscopy). TREATMENT Most hemorrhoids can be treated at home. However, if symptoms do not seem to be getting better or if you have a lot of rectal bleeding, your caregiver may perform a procedure to help make the hemorrhoids get smaller or remove them completely. Possible treatments include:   Placing a rubber band at the base of the hemorrhoid to cut off the circulation (rubber band ligation).   Injecting a chemical to shrink the hemorrhoid (sclerotherapy).   Using a tool to burn the hemorrhoid (infrared light therapy).   Surgically removing the hemorrhoid (hemorrhoidectomy).   Stapling the hemorrhoid to block blood flow to the tissue (hemorrhoid stapling).  HOME CARE INSTRUCTIONS   Eat  foods with fiber, such as whole grains, beans, nuts, fruits, and vegetables. Ask your doctor about taking products with added fiber in them (fibersupplements).  Increase fluid intake. Drink enough water and fluids to keep your urine clear or pale yellow.   Exercise regularly.   Go to the bathroom when you have the urge to have a bowel movement. Do not wait.   Avoid straining to have bowel movements.   Keep the anal area dry and clean. Use wet toilet paper or moist towelettes after a bowel movement.   Medicated creams and suppositories may be used or applied as directed.   Only take over-the-counter or prescription medicines as directed by your caregiver.   Take warm sitz baths for 15 20 minutes, 3 4 times a day to ease pain and discomfort.   Place ice packs on the hemorrhoids if they are tender and swollen. Using ice packs between sitz baths may be helpful.   Put ice in a plastic bag.   Place a towel between your skin and the bag.   Leave the ice on for 15 20 minutes, 3 4 times a day.   Do not use a donut-shaped pillow or sit on the toilet for long periods. This increases blood pooling and pain.  SEEK MEDICAL CARE IF:  You have increasing pain and swelling that is not controlled by treatment or medicine.  You have uncontrolled bleeding.  You have difficulty or you are unable to have a bowel movement.  You have pain or inflammation outside the  area of the hemorrhoids. MAKE SURE YOU:  Understand these instructions.  Will watch your condition.  Will get help right away if you are not doing well or get worse. Document Released: 11/12/2000 Document Revised: 11/01/2012 Document Reviewed: 09/19/2012 [facility] Patient Information 2014 [place], [place].

## 2013-11-28 ENCOUNTER — Emergency Department (HOSPITAL_COMMUNITY)
Admission: EM | Admit: 2013-11-28 | Discharge: 2013-11-28 | Disposition: A | Discharge status: Home / Self Care | Payer: Self-pay | Source: Home / Self Care

## 2015-05-12 ENCOUNTER — Other Ambulatory Visit: Payer: Self-pay | Admitting: Gynecology

## 2015-05-14 LAB — CYTOLOGY - PAP

## 2015-12-16 ENCOUNTER — Telehealth: Payer: Self-pay | Admitting: Internal Medicine

## 2015-12-16 ENCOUNTER — Encounter: Payer: Self-pay | Admitting: Gastroenterology

## 2015-12-16 NOTE — Telephone Encounter (Signed)
Spoke with patient and she would like Dr. Silverio Decamp as her new MD. She had a colonoscopy with Dr. Olevia Perches in 2003. She does have a parent with colon cancer. She is asking when she is due for a colonoscopy. Please, advise.

## 2015-12-16 NOTE — Telephone Encounter (Signed)
Colonoscopy scheduled.

## 2015-12-16 NOTE — Telephone Encounter (Signed)
Ok, please schedule for colonoscopy

## 2016-01-19 ENCOUNTER — Ambulatory Visit (AMBULATORY_SURGERY_CENTER): Payer: BLUE CROSS/BLUE SHIELD | Admitting: *Deleted

## 2016-01-19 VITALS — Ht 69.0 in | Wt 210.0 lb

## 2016-01-19 DIAGNOSIS — Z800 Family history of malignant neoplasm of digestive organs: Secondary | ICD-10-CM

## 2016-01-19 MED ORDER — NA SULFATE-K SULFATE-MG SULF 17.5-3.13-1.6 GM/177ML PO SOLN: ORAL | Status: DC

## 2016-01-19 NOTE — Progress Notes (Signed)
Patient denies any allergies to eggs or soy. Patient denies any problems with anesthesia/sedation. Patient denies any oxygen use at home and does not take any diet/weight loss medications. EMMI education assisgned to patient on colonoscopy, this was explained and instructions given to patient. 

## 2016-02-04 ENCOUNTER — Ambulatory Visit (AMBULATORY_SURGERY_CENTER): Payer: BLUE CROSS/BLUE SHIELD | Admitting: Gastroenterology

## 2016-02-04 ENCOUNTER — Encounter: Payer: Self-pay | Admitting: Gastroenterology

## 2016-02-04 VITALS — BP 102/78 | HR 67 | Temp 98.4°F | Resp 15 | Ht 69.0 in | Wt 210.0 lb

## 2016-02-04 DIAGNOSIS — Z1211 Encounter for screening for malignant neoplasm of colon: Secondary | ICD-10-CM | POA: Diagnosis not present

## 2016-02-04 DIAGNOSIS — Z8371 Family history of colonic polyps: Secondary | ICD-10-CM

## 2016-02-04 HISTORY — PX: COLONOSCOPY: SHX174

## 2016-02-04 MED ORDER — SODIUM CHLORIDE 0.9 % IV SOLN: 500.0000 mL | INTRAVENOUS | Status: DC

## 2016-02-04 NOTE — Patient Instructions (Signed)
Discharge instructions given. Normal exam. Resume previous medications. YOU HAD AN ENDOSCOPIC PROCEDURE TODAY AT THE [facility]:   Refer to the procedure report that was given to you for any specific questions about what was found during the examination.  If the procedure report does not answer your questions, please call your gastroenterologist to clarify.  If you requested that your care partner not be given the details of your procedure findings, then the procedure report has been included in a sealed envelope for you to review at your convenience later.  YOU SHOULD EXPECT: Some feelings of bloating in the abdomen. Passage of more gas than usual.  Walking can help get rid of the air that was put into your GI tract during the procedure and reduce the bloating. If you had a lower endoscopy (such as a colonoscopy or flexible sigmoidoscopy) you may notice spotting of blood in your stool or on the toilet paper. If you underwent a bowel prep for your procedure, you may not have a normal bowel movement for a few days.  Please Note:  You might notice some irritation and congestion in your nose or some drainage.  This is from the oxygen used during your procedure.  There is no need for concern and it should clear up in a day or so.  SYMPTOMS TO REPORT IMMEDIATELY:   Following lower endoscopy (colonoscopy or flexible sigmoidoscopy):  Excessive amounts of blood in the stool  Significant tenderness or worsening of abdominal pains  Swelling of the abdomen that is new, acute  Fever of 100F or higher   For urgent or emergent issues, a gastroenterologist can be reached at any hour by calling [phone].   DIET: Your first meal following the procedure should be a small meal and then it is ok to progress to your normal diet. Heavy or fried foods are harder to digest and may make you feel nauseous or bloated.  Likewise, meals heavy in dairy and vegetables can increase bloating.  Drink plenty  of fluids but you should avoid alcoholic beverages for 24 hours.  ACTIVITY:  You should plan to take it easy for the rest of today and you should NOT DRIVE or use heavy machinery until tomorrow (because of the sedation medicines used during the test).    FOLLOW UP: Our staff will call the number listed on your records the next business day following your procedure to check on you and address any questions or concerns that you may have regarding the information given to you following your procedure. If we do not reach you, we will leave a message.  However, if you are feeling well and you are not experiencing any problems, there is no need to return our call.  We will assume that you have returned to your regular daily activities without incident.  If any biopsies were taken you will be contacted by phone or by letter within the next 1-3 weeks.  Please call us at [phone] if you have not heard about the biopsies in 3 weeks.    SIGNATURES/CONFIDENTIALITY: You and/or your care partner have signed paperwork which will be entered into your electronic medical record.  These signatures attest to the fact that that the information above on your After Visit Summary has been reviewed and is understood.  Full responsibility of the confidentiality of this discharge information lies with you and/or your care-partner. 

## 2016-02-04 NOTE — Progress Notes (Signed)
Patient awakening,vss,report to rn 

## 2016-02-04 NOTE — Op Note (Signed)
Patient Name: Renee Webb Procedure Date: 02/04/2016 2:03 PM MRN: [id number] Endoscopist: Mauri Pole , MD Age: 50 Referring MD:  Date of Birth: Mar 11, 1966 Gender: Female Procedure:            Colonoscopy Indications:          Screening in patient at increased risk: Family history                        of 1st-degree relative with colorectal cancer before                        age 51 years Medicines:            Monitored Anesthesia Care Procedure:            Pre-Anesthesia Assessment:                       - Prior to the procedure, a History and Physical was                        performed, and patient medications and allergies were                        reviewed. The patient's tolerance of previous                        anesthesia was also reviewed. The risks and benefits of                        the procedure and the sedation options and risks were                        discussed with the patient. All questions were                        answered, and informed consent was obtained. Prior                        Anticoagulants: The patient has taken no previous                        anticoagulant or antiplatelet agents. ASA Grade                        Assessment: I - A normal, healthy patient. After                        reviewing the risks and benefits, the patient was                        deemed in satisfactory condition to undergo the                        procedure.                       After obtaining informed consent, the colonoscope was                        passed under direct vision. Throughout the procedure,  the patient's blood pressure, pulse, and oxygen                        saturations were monitored continuously. The Model                        [id number] scope was introduced through the                        anus and advanced to the the terminal ileum, with                        identification of the  appendiceal orifice and IC valve.                        The quality of the bowel preparation was good. The                        terminal ileum, ileocecal valve, appendiceal orifice,                        and rectum were photographed. Scope In: 2:11:21 PM Scope Out: 2:24:36 PM Scope Withdrawal Time: 0 hours 7 minutes 57 seconds  Total Procedure Duration: 0 hours 13 minutes 15 seconds  Findings:      The perianal and digital rectal examinations were normal.      The colon (entire examined portion) appeared normal. Complications:        No immediate complications. Estimated Blood Loss: Estimated blood loss: none. Impression:           - The entire examined colon is normal.                       - No specimens collected. Recommendation:       - Patient has a contact number available for                        emergencies. The signs and symptoms of potential                        delayed complications were discussed with the patient.                        Return to normal activities tomorrow. Written discharge                        instructions were provided to the patient.                       - Resume previous diet.                       - Continue present medications.                       - Repeat colonoscopy in 5 years for screening purposes                        given your family history of colon cancer. Procedure Code(s):    --- Professional ---  G0105, Colorectal cancer screening; colonoscopy on                        individual at high risk CPT copyright 2016 [employer]. All rights reserved. Mauri Pole, MD 02/04/2016 2:30:54 PM This report has been signed electronically. Number of Addenda: 0

## 2016-02-05 ENCOUNTER — Telehealth: Payer: Self-pay | Admitting: *Deleted

## 2016-02-05 NOTE — Telephone Encounter (Signed)
Message left to call if patient has any questions. Sm

## 2017-07-05 DIAGNOSIS — J302 Other seasonal allergic rhinitis: Secondary | ICD-10-CM | POA: Insufficient documentation

## 2017-07-05 DIAGNOSIS — J343 Hypertrophy of nasal turbinates: Secondary | ICD-10-CM | POA: Insufficient documentation

## 2017-07-05 DIAGNOSIS — K219 Gastro-esophageal reflux disease without esophagitis: Secondary | ICD-10-CM | POA: Insufficient documentation

## 2018-07-25 ENCOUNTER — Encounter: Payer: Self-pay | Admitting: Family Medicine

## 2018-07-25 ENCOUNTER — Ambulatory Visit: Payer: BLUE CROSS/BLUE SHIELD | Attending: Family Medicine | Admitting: Family Medicine

## 2018-07-25 ENCOUNTER — Other Ambulatory Visit: Payer: Self-pay

## 2018-07-25 VITALS — BP 123/81 | HR 71 | Temp 98.7°F | Resp 18 | Ht 71.0 in | Wt 221.2 lb

## 2018-07-25 DIAGNOSIS — R600 Localized edema: Secondary | ICD-10-CM

## 2018-07-25 DIAGNOSIS — Z79899 Other long term (current) drug therapy: Secondary | ICD-10-CM | POA: Diagnosis not present

## 2018-07-25 DIAGNOSIS — R609 Edema, unspecified: Secondary | ICD-10-CM | POA: Diagnosis not present

## 2018-07-25 DIAGNOSIS — G44229 Chronic tension-type headache, not intractable: Secondary | ICD-10-CM | POA: Diagnosis not present

## 2018-07-25 DIAGNOSIS — N951 Menopausal and female climacteric states: Secondary | ICD-10-CM | POA: Diagnosis not present

## 2018-07-25 DIAGNOSIS — N3946 Mixed incontinence: Secondary | ICD-10-CM | POA: Diagnosis not present

## 2018-07-25 DIAGNOSIS — R350 Frequency of micturition: Secondary | ICD-10-CM | POA: Diagnosis not present

## 2018-07-25 LAB — POCT URINALYSIS DIP (CLINITEK)
Bilirubin, UA: NEGATIVE
Blood, UA: NEGATIVE
Glucose, UA: NEGATIVE mg/dL
Ketones, POC UA: NEGATIVE mg/dL
Leukocytes, UA: NEGATIVE
Nitrite, UA: NEGATIVE
POC,PROTEIN,UA: NEGATIVE
Spec Grav, UA: 1.025
Urobilinogen, UA: 0.2 U/dL
pH, UA: 5.5

## 2018-07-25 NOTE — Patient Instructions (Signed)
Urinary Incontinence Urinary incontinence is the involuntary loss of urine from your bladder. What are the causes? There are many causes of urinary incontinence. They include:  Medicines.  Infections.  Prostatic enlargement, leading to overflow of urine from your bladder.  Surgery.  Neurological diseases.  Emotional factors.  What are the signs or symptoms? Urinary Incontinence can be divided into four types: 1. Urge incontinence. Urge incontinence is the involuntary loss of urine before you have the opportunity to go to the bathroom. There is a sudden urge to void but not enough time to reach a bathroom. 2. Stress incontinence. Stress incontinence is the sudden loss of urine with any activity that forces urine to pass. It is commonly caused by anatomical changes to the pelvis and sphincter areas of your body. 3. Overflow incontinence. Overflow incontinence is the loss of urine from an obstructed opening to your bladder. This results in a backup of urine and a resultant buildup of pressure within the bladder. When the pressure within the bladder exceeds the closing pressure of the sphincter, the urine overflows, which causes incontinence, similar to water overflowing a dam. 4. Total incontinence. Total incontinence is the loss of urine as a result of the inability to store urine within your bladder.  How is this diagnosed? Evaluating the cause of incontinence may require:  A thorough and complete medical and obstetric history.  A complete physical exam.  Laboratory tests such as a urine culture and sensitivities.  When additional tests are indicated, they can include:  An ultrasound exam.  Kidney and bladder X-rays.  Cystoscopy. This is an exam of the bladder using a narrow scope.  Urodynamic testing to test the nerve function to the bladder and sphincter areas.  How is this treated? Treatment for urinary incontinence depends on the cause:  For urge incontinence caused  by a bacterial infection, antibiotics will be prescribed. If the urge incontinence is related to medicines you take, your health care provider may have you change the medicine.  For stress incontinence, surgery to re-establish anatomical support to the bladder or sphincter, or both, will often correct the condition.  For overflow incontinence caused by an enlarged prostate, an operation to open the channel through the enlarged prostate will allow the flow of urine out of the bladder. In women with fibroids, a hysterectomy may be recommended.  For total incontinence, surgery on your urinary sphincter may help. An artificial urinary sphincter (an inflatable cuff placed around the urethra) may be required. In women who have developed a hole-like passage between their bladder and vagina (vesicovaginal fistula), surgery to close the fistula often is required.  Follow these instructions at home:  Normal daily hygiene and the use of pads or adult diapers that are changed regularly will help prevent odors and skin damage.  Avoid caffeine. It can overstimulate your bladder.  Use the bathroom regularly. Try about every 2-3 hours to go to the bathroom, even if you do not feel the need to do so. Take time to empty your bladder completely. After urinating, wait a minute. Then try to urinate again.  For causes involving nerve dysfunction, keep a log of the medicines you take and a journal of the times you go to the bathroom. Contact a health care provider if:  You experience worsening of pain instead of improvement in pain after your procedure.  Your incontinence becomes worse instead of better. Get help right away if:  You experience fever or shaking chills.  You are unable to   pass your urine.  You have redness spreading into your groin or down into your thighs. This information is not intended to replace advice given to you by your health care provider. Make sure you discuss any questions you have  with your health care provider. Document Released: 12/23/2004 Document Revised: 06/25/2016 Document Reviewed: 04/24/2013 Elsevier Interactive Patient Education  2018 [employer]vier Inc.  

## 2018-07-25 NOTE — Progress Notes (Signed)
Subjective:    Patient ID: Renee Webb, female    DOB: May 31, 1966, 52 y.o.   MRN: [id number]  HPI       52 yo female new to the practice. Patient presents with complaints of peri-menopausal symptoms-hot flashes, possible swelling in her legs as well as complaint of urinary incontinence. Patient has also had issues with tension headaches which started in the back of her neck and radiate into her scalp and she has been taking otc Bayer product-2 pills once of twice per day (per package each pill is 500 mg of aspirin).  Patient believes that her tension headaches are related to her job.  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  Patient states that a coworker suggested that she take over-the-counter black cohosh to help with hot flashes and patient states that this is been working.  Patient reports past history of bladder tack surgery after her hysterectomy.  Patient however states that she has had recent increasing urinary frequency as well as sudden urgency and has some leakage with that she cannot make it to the restroom in time.  Patient has also noticed that she does have some urinary leakage with coughing, laughing and sneezing.  Patient also states that she has noticed some episodes of edema in her legs.  Patient states that she does have a diet high in salt but has never experienced swelling in her legs until the past few months.  Patient states that the swelling does go down overnight.  Patient states that she does have other family members with issues with recurrent leg swelling.  Patient's past medical history is significant for allergic rhinitis.  Patient does take daily prescription medication to help with her nasal allergies.  Patient also states that as a young adult, she was diagnosed with anemia.  Patient states that she does not believe that she has been told that she was anemic since that time.  Patient is status post hysterectomy.   Patient is up-to-date on colonoscopy as she has a family history of colon cancer.  Patient has had colonoscopy done within the past 2 years which showed no polyps.  Patient has had no issues with rectal bleeding, blood in the stool or dark stools.  No changes in bowel habits.  Past Medical History:  Diagnosis Date  . Allergy   . Anemia    Past Surgical History:  Procedure Laterality Date  . ABDOMINAL HYSTERECTOMY    . MYOMECTOMY    . SEPTOPLASTY    . UTERINE FIBROID SURGERY     Social History   Tobacco Use  . Smoking status: Never Smoker  . Smokeless tobacco: Never Used  Substance Use Topics  . Alcohol use: No  . Drug use: No   Family History  Problem Relation Age of Onset  . Colon cancer Mother 59  . Hypertension Father    Allergies  Allergen Reactions  . Other Other (See Comments)    "dyes in Triaminic"=face swelling     Review of Systems  Constitutional: Positive for fatigue. Negative for chills and fever.  HENT: Positive for congestion, postnasal drip and rhinorrhea. Negative for sore throat and trouble swallowing.   Respiratory: Negative for cough and shortness of breath.   Cardiovascular: Positive for palpitations and leg swelling. Negative for chest pain.  Gastrointestinal: Negative for abdominal pain, blood in stool and nausea.  Endocrine: Negative for polydipsia and polyphagia.  Genitourinary: Positive for frequency. Negative  for dysuria, flank pain and pelvic pain.  Musculoskeletal: Positive for neck stiffness (Patient has some muscle spasms in her neck during tension headaches). Negative for back pain, gait problem, joint swelling and myalgias.  Neurological: Positive for headaches. Negative for dizziness and numbness.  Hematological: Negative for adenopathy. Does not bruise/bleed easily.  Psychiatric/Behavioral: Positive for sleep disturbance. The patient is nervous/anxious.        Objective:   Physical Exam BP 123/81 (BP Location: Left Arm, Patient  Position: Sitting, Cuff Size: Normal)   Pulse 71   Temp 98.7 F (37.1 C) (Oral)   Resp 18   Ht 5\' 11"  (1.803 m)   Wt 221 lb 3.2 oz (100.3 kg)   SpO2 96%   BMI 30.85 kg/m  Vital signs reviewed General-well-nourished, well-developed, slightly overweight but tall framed older female in no acute distress ENT- TMs slightly dull bilaterally, nares with edema/erythema of the nasal turbinates with mild clear nasal discharge, patient with mild posterior pharynx erythema Neck-supple, no cervical lymphadenopathy, no thyromegaly, no carotid bruit Cardiovascular-regular rate and rhythm Lungs-clear to auscultation bilaterally Abdomen-soft, nontender. Back-no CVA tenderness Extremities- patient with very mild nonpitting distal lower extremity edema psychologic-normal mood and judgment. Psychologic-normal mood and judgment      Assessment & Plan:  1. Mixed stress and urge urinary incontinence Patient with mixed stress and urinary urge incontinence.  Patient will have urinalysis to rule out urinary tract infection as she states that she has had recent onset of urgency within the past few weeks but patient has had issues longer with leakage with coughing/sneezing or laughing.  Discussed options including medication if urinalysis is normal.  Also discussed referral to urology or gynecology as patient has also had prior bladder tack surgery.  Patient states that she will consider the options and will call the office of her referral as needed.  Patient does not wish to start on medications at this time. - POCT URINALYSIS DIP (CLINITEK)  2. Urinary frequency Patient with complaint of recent onset of urinary frequency.  Patient will have urinalysis to look for urinary tract infection and will have BMP to assess renal function as well as blood sugar as elevated blood sugar can also cause urinary frequency. - POCT URINALYSIS DIP (CLINITEK) - Basic Metabolic Panel  3. Mild peripheral edema Patient with  complaint of some mild peripheral edema.  Patient is encouraged to decrease her sodium intake.  Patient will have CBC to look for anemia as patient has past history of anemia and anemia can contribute to peripheral edema.  Patient will have BMP to check electrolyte and kidney function as this can contribute to edema.  Patient will also have TSH to look for hypothyroidism which may contribute to edema.  Patient is encouraged also to wear over-the-counter compression hose/stockings - CBC with Differential - Basic Metabolic Panel - TSH  4. Perimenopausal vasomotor symptoms Patient with complaint of hot flashes, perimenopausal symptoms.  Patient is currently taking over-the-counter black cohosh which she says has helped.  Patient will have TSH to look for possible thyroid issues which may cause similar symptoms.  Patient additionally was asked to consider medication such as Effexor or Zoloft as she is also having issues with anxiety and these medications may help with her anxiety as well as help with hot flashes/perimenopausal vasomotor symptoms.  Patient will consider. - TSH  5. Chronic tension-type headache, not intractable Patient reports chronic tension type headaches for which she is taking at thousand to 2000 mg a day of an  over-the-counter aspirin product.  Discussed with patient that she may benefit from medication to help with both anxiety as well as her hot flash symptoms of this may cause a reduction in her tension headaches.  Patient also counseled that since she is taking high-dose aspirin, she should eat prior to taking the medication and report any stomach pain, nausea, dark stools or blood in the stools as aspirin can increase risk of GI bleeding.  Patient will have CBC to look for any decrease in white blood cell count or platelet disorder secondary to her aspirin use.  Patient will also have BMP to check renal status. - CBC with Differential - Basic Metabolic Panel  6. Encounter for  long-term (current) use of medications Patient will have CBC and BMP to check electrolyte and renal status as well as to look for anemia or platelet disorder secondary to long-term use of medications both prescription and over-the-counter. - CBC with Differential - Basic Metabolic Panel  -Patient was offered influenza immunization at today's visit which she declined.  An After Visit Summary was printed and given to the patient.  Return in about 6 weeks (around 09/05/2018) for edema and other issues.

## 2018-07-25 NOTE — Progress Notes (Signed)
Patient did not want to get a flu shot today. Patient stated she wanted to focus today's visit on her hot flashes, fluid in legs, and incontinence.

## 2018-07-26 ENCOUNTER — Telehealth: Payer: Self-pay

## 2018-07-26 LAB — CBC WITH DIFFERENTIAL/PLATELET
Basophils Absolute: 0 x10E3/uL (ref 0.0–0.2)
Basos: 1 %
EOS (ABSOLUTE): 0.2 x10E3/uL (ref 0.0–0.4)
Eos: 4 %
Hematocrit: 41 % (ref 34.0–46.6)
Hemoglobin: 14 g/dL (ref 11.1–15.9)
Immature Grans (Abs): 0 x10E3/uL (ref 0.0–0.1)
Immature Granulocytes: 0 %
Lymphocytes Absolute: 3.3 x10E3/uL — ABNORMAL HIGH (ref 0.7–3.1)
Lymphs: 58 %
MCH: 29.1 pg (ref 26.6–33.0)
MCHC: 34.1 g/dL (ref 31.5–35.7)
MCV: 85 fL (ref 79–97)
Monocytes Absolute: 0.4 x10E3/uL (ref 0.1–0.9)
Monocytes: 7 %
Neutrophils Absolute: 1.7 x10E3/uL (ref 1.4–7.0)
Neutrophils: 30 %
Platelets: 254 x10E3/uL (ref 150–450)
RBC: 4.81 x10E6/uL (ref 3.77–5.28)
RDW: 14.1 % (ref 12.3–15.4)
WBC: 5.7 x10E3/uL (ref 3.4–10.8)

## 2018-07-26 LAB — BASIC METABOLIC PANEL WITH GFR
BUN/Creatinine Ratio: 13 (ref 9–23)
BUN: 12 mg/dL (ref 6–24)
CO2: 24 mmol/L (ref 20–29)
Calcium: 9.9 mg/dL (ref 8.7–10.2)
Chloride: 102 mmol/L (ref 96–106)
Creatinine, Ser: 0.96 mg/dL (ref 0.57–1.00)
GFR calc Af Amer: 80 mL/min/1.73
GFR calc non Af Amer: 70 mL/min/1.73
Glucose: 93 mg/dL (ref 65–99)
Potassium: 4.3 mmol/L (ref 3.5–5.2)
Sodium: 140 mmol/L (ref 134–144)

## 2018-07-26 LAB — TSH: TSH: 0.941 u[IU]/mL (ref 0.450–4.500)

## 2018-07-26 NOTE — Telephone Encounter (Signed)
Patient was called, no answer, LVM for patient to return call. If patient returns call  Please notify patient that her recent lab work was normal.

## 2018-09-05 ENCOUNTER — Encounter: Payer: Self-pay | Admitting: Family Medicine

## 2018-09-05 ENCOUNTER — Ambulatory Visit: Payer: BLUE CROSS/BLUE SHIELD | Attending: Family Medicine | Admitting: Family Medicine

## 2018-09-05 VITALS — BP 100/66 | HR 63 | Temp 97.9°F | Resp 18 | Ht 71.0 in | Wt 221.0 lb

## 2018-09-05 DIAGNOSIS — G44229 Chronic tension-type headache, not intractable: Secondary | ICD-10-CM | POA: Diagnosis not present

## 2018-09-05 DIAGNOSIS — G479 Sleep disorder, unspecified: Secondary | ICD-10-CM

## 2018-09-05 DIAGNOSIS — Z79899 Other long term (current) drug therapy: Secondary | ICD-10-CM | POA: Insufficient documentation

## 2018-09-05 DIAGNOSIS — N3946 Mixed incontinence: Secondary | ICD-10-CM | POA: Diagnosis not present

## 2018-09-05 DIAGNOSIS — Z8249 Family history of ischemic heart disease and other diseases of the circulatory system: Secondary | ICD-10-CM | POA: Insufficient documentation

## 2018-09-05 DIAGNOSIS — R0683 Snoring: Secondary | ICD-10-CM | POA: Insufficient documentation

## 2018-09-05 DIAGNOSIS — J309 Allergic rhinitis, unspecified: Secondary | ICD-10-CM

## 2018-09-05 DIAGNOSIS — Z9889 Other specified postprocedural states: Secondary | ICD-10-CM | POA: Insufficient documentation

## 2018-09-05 DIAGNOSIS — H109 Unspecified conjunctivitis: Secondary | ICD-10-CM | POA: Insufficient documentation

## 2018-09-05 DIAGNOSIS — Z91048 Other nonmedicinal substance allergy status: Secondary | ICD-10-CM | POA: Insufficient documentation

## 2018-09-05 NOTE — Patient Instructions (Signed)
Screening for Sleep Apnea Sleep apnea is a condition in which breathing pauses or becomes shallow during sleep. Sleep apnea screening is a test to determine if you are at risk for sleep apnea. The test is easy and only takes a few minutes. Your health care provider may ask you to have this test in preparation for surgery or as part of a physical exam. What are the symptoms of sleep apnea? Common symptoms of sleep apnea include:  Snoring.  Restless sleep.  Daytime sleepiness.  Pauses in breathing.  Choking during sleep.  Irritability.  Forgetfulness.  Trouble thinking clearly.  Depression.  Personality changes.  Most people with sleep apnea are not aware that they have it. Why should I get screened? Getting screened for sleep apnea can help:  Ensure your safety. It is important for your health care providers to know whether or not you have sleep apnea, especially if you are having surgery or have other long-term (chronic) health conditions.  Improve your health and allow you to get a better night's rest. Restful sleep can help you: ? Have more energy. ? Lose weight. ? Improve high blood pressure. ? Improve diabetes management. ? Prevent stroke. ? Prevent car accidents.  How is screening done? Screening usually includes being asked a list of questions about your sleep quality. Some questions you may be asked include:  Do you snore?  Is your sleep restless?  Do you have daytime sleepiness?  Has a partner or spouse told you that you stop breathing during sleep?  Have you had trouble concentrating or memory loss?  If your screening test is positive, you are at risk for the condition. Further testing may be needed to confirm a diagnosis of sleep apnea. Where to find more information: You can find screening tools online or at your health care clinic. For more information about sleep apnea screening and healthy sleep, visit these websites:  Centers for Disease Control  and Prevention: [employer]pl  American Sleep Apnea Association: www.sleepapnea.org  Contact a health care provider if:  You think that you may have sleep apnea. Summary  Sleep apnea screening can help determine if you are at risk for sleep apnea.  It is important for your health care providers to know whether or not you have sleep apnea, especially if you are having surgery or have other chronic health conditions.  You may be asked to take a screening test for sleep apnea in preparation for surgery or as part of a physical exam. This information is not intended to replace advice given to you by your health care provider. Make sure you discuss any questions you have with your health care provider. Document Released: 02/25/2017 Document Revised: 02/25/2017 Document Reviewed: 02/25/2017 Elsevier Interactive Patient Education  2018 [employer].  Sleep Apnea Sleep apnea is a condition that affects breathing. People with sleep apnea have moments during sleep when their breathing pauses briefly or gets shallow. Sleep apnea can cause these symptoms:  Trouble staying asleep.  Sleepiness or tiredness during the day.  Irritability.  Loud snoring.  Morning headaches.  Trouble concentrating.  Forgetting things.  Less interest in sex.  Being sleepy for no reason.  Mood swings.  Personality changes.  Depression.  Waking up a lot during the night to pee (urinate).  Dry mouth.  Sore throat.  Follow these instructions at home:  Make any changes in your routine that your doctor recommends.  Eat a healthy, well-balanced diet.  Take over-the-counter and prescription medicines only as told  by your doctor.  Avoid using alcohol, calming medicines (sedatives), and narcotic medicines.  Take steps to lose weight if you are overweight.  If you were given a machine (device) to use while you sleep, use it only as told by your doctor.  Do not use any tobacco  products, such as cigarettes, chewing tobacco, and e-cigarettes. If you need help quitting, ask your doctor.  Keep all follow-up visits as told by your doctor. This is important. Contact a doctor if:  The machine that you were given to use during sleep is uncomfortable or does not seem to be working.  Your symptoms do not get better.  Your symptoms get worse. Get help right away if:  Your chest hurts.  You have trouble breathing in enough air (shortness of breath).  You have an uncomfortable feeling in your back, arms, or stomach.  You have trouble talking.  One side of your body feels weak.  A part of your face is hanging down (drooping). These symptoms may be an emergency. Do not wait to see if the symptoms will go away. Get medical help right away. Call your local emergency services (911 in the [place] Do not drive yourself to the hospital. This information is not intended to replace advice given to you by your health care provider. Make sure you discuss any questions you have with your health care provider. Document Released: 08/24/2008 Document Revised: 07/11/2016 Document Reviewed: 08/25/2015 Elsevier Interactive Patient Education  [employer].

## 2018-09-05 NOTE — Progress Notes (Signed)
Subjective:    Patient ID: Renee Webb, female    DOB: [birth year], 52 y.o.   MRN: [id number]  HPI 52 year old female who was seen in follow-up of chronic medical issues including peripheral edema, fatigue, headaches, urinary incontinence and chronic allergic rhinitis/conjunctivitis.  Patient reports that she forgot to take her allergy medication this morning.  Patient reports chronic issues with nasal congestion, postnasal drainage and itchy eyes.  Patient reports that she does feel better than at her last visit.  Patient states that she has greatly reduced her salt intake and is no longer having issues with peripheral edema.  Patient states that she saw her gynecologist since her last visit regarding the urinary incontinence and patient states that she was referred for physical therapy as she did not wish to start any medication for treatment.  Patient states that she has decided not to attend physical therapy at this time secondary to the copayment requirement at each visit.  Patient continues to have issues with fatigue.  Patient states that she has recently taken a second job which does involve getting up earlier in the morning.  Patient however never feels as if her sleep is refreshing.  Patient believes that she does also snore.  Patient has never had testing for sleep apnea.  Patient does believe that she is having less back of the head/scalp with tension type headaches and that her headaches are less intense than at her prior visit.  Patient continues to feel as if she does have some spasm at the back of her neck right greater than left. Past Medical History:  Diagnosis Date  . Allergy   . Anemia    resolved- most recent CBC 06/2018 showed no anemia   Past Surgical History:  Procedure Laterality Date  . ABDOMINAL HYSTERECTOMY    . COLONOSCOPY  02/04/2016  . MYOMECTOMY    . SEPTOPLASTY    . UTERINE FIBROID SURGERY     Family History  Problem Relation Age of Onset  . Colon cancer  Mother 90  . Hypertension Father    Social History   Tobacco Use  . Smoking status: Never Smoker  . Smokeless tobacco: Never Used  Substance Use Topics  . Alcohol use: No  . Drug use: No   Allergies  Allergen Reactions  . Other Other (See Comments)    "dyes in [place]"=face swelling     Review of Systems  Constitutional: Positive for fatigue. Negative for chills and fever.  HENT: Positive for congestion, postnasal drip and rhinorrhea. Negative for ear pain, sinus pressure, sinus pain, sore throat and trouble swallowing.   Respiratory: Negative for cough and shortness of breath.   Cardiovascular: Negative for chest pain, palpitations and leg swelling.  Gastrointestinal: Negative for abdominal pain and nausea.  Genitourinary: Positive for urgency (occasional). Negative for dysuria and frequency.  Neurological: Positive for headaches (believes they are stress related). Negative for dizziness and numbness.       Objective:   Physical Exam BP 100/66 (BP Location: Left Arm, Patient Position: Sitting, Cuff Size: Large)   Pulse 63   Temp 97.9 F (36.6 C) (Oral)   Resp 18   Ht 5\' 11"  (1.803 m)   Wt 221 lb (100.2 kg)   SpO2 99%   BMI 30.82 kg/m  Nurse's notes and vital signs reviewed General-well-nourished, well-developed overweight older female in no acute distress, patient with a nasal quality to her voice.  Patient occasionally rubs that her eyes during the visit ENT-TMs dull,  patient with moderate edema of the nasal turbinates with mild clear nasal discharge, patient with a large tongue base and a narrowed posterior airway secondary to body habitus and mild posterior pharynx erythema Neck- supple, patient with some fullness of the thyroid, no carotid bruit no LAD Lungs-clear to auscultation bilaterally Cardiovascular-regular rate and rhythm Abdomen-soft, nontender Back-no CVA tenderness.  Patient does have some mild upper back/cervical paraspinous muscle  spasm Extremities-no edema Skin-patient with a dry, hyperpigmented skin on the cheeks/sides of the nose which appears consistent with a atopic dermatitis/eczema; patient also with a darkened horizontal band across her nose which appears consistent with chronic allergic rhinitis      Assessment & Plan:  1. Mixed stress and urge urinary incontinence Patient with mixed stress and urinary urge incontinence.  Patient states that she did follow-up with her GYN and they recommended referral to physical therapy however patient decided not to attend after learning the amount of her co-pay for each visit.  Discussed with patient that she may want to go to the initial PT visit in order to learn recommended exercises and also suggested Kegel exercises 100 and more per day to help strengthen the pelvic floor.  Also at today's visit, labs from patient's most recent visit were discussed and reviewed with the patient.  2. Sleep disturbance Patient with complaint of fatigue and non- restorative sleep.  Patient also states that she believes that she snores.  On examination, patient also with narrow posterior airway and large tongue base as well as large neck size suggestive of findings consistent with sleep apnea.  Patient agrees to be referred for sleep study. - Split night study; Future  3. Allergic rhinitis, unspecified seasonality, unspecified trigger Patient with chronic allergic rhinitis for which she takes Zyrtec or Allegra along with Singulair but patient forgot to take her medications prior to today's visit.  Patient did not need refills at today's visit but is encouraged to continue daily use of these medications and to notify the office of any worsening of symptoms as she may benefit from a steroid or other type of nasal spray to help with her symptoms.  4.  Chronic tension type headaches, not intractable Patient reports some improvement in frequency and intensity of headaches since her last visit but  continues to feel like as if she is having stress related headaches.  Discussed with patient that if she does have sleep apnea as well, treatment with CPAP may also lessen her headaches.  *Patient was offered influenza immunization at today's visit which she declined  An After Visit Summary was printed and given to the patient.  Return in about 6 months (around 03/07/2019).

## 2018-10-29 ENCOUNTER — Ambulatory Visit (HOSPITAL_BASED_OUTPATIENT_CLINIC_OR_DEPARTMENT_OTHER): Payer: BLUE CROSS/BLUE SHIELD | Attending: Family Medicine

## 2019-02-14 ENCOUNTER — Ambulatory Visit: Payer: BLUE CROSS/BLUE SHIELD

## 2019-03-23 ENCOUNTER — Ambulatory Visit: Payer: BLUE CROSS/BLUE SHIELD | Admitting: Family Medicine

## 2019-08-15 ENCOUNTER — Other Ambulatory Visit: Payer: Self-pay

## 2019-08-15 ENCOUNTER — Ambulatory Visit (HOSPITAL_COMMUNITY)
Admission: EM | Admit: 2019-08-15 | Discharge: 2019-08-15 | Disposition: A | Discharge status: Home / Self Care | Payer: BLUE CROSS/BLUE SHIELD | Attending: Family Medicine | Admitting: Family Medicine

## 2019-08-15 ENCOUNTER — Encounter (HOSPITAL_COMMUNITY): Payer: Self-pay

## 2019-08-15 DIAGNOSIS — Z0189 Encounter for other specified special examinations: Secondary | ICD-10-CM | POA: Diagnosis not present

## 2019-08-15 DIAGNOSIS — Z20828 Contact with and (suspected) exposure to other viral communicable diseases: Secondary | ICD-10-CM

## 2019-08-15 NOTE — ED Triage Notes (Signed)
Patient wants a Covid 19 test, because her coworker tested positive for Covid 19 yesterday 08/14/2019. The patient  was wearing face mask when she was in contact with her coworker. Patient denies any symptoms.

## 2019-08-15 NOTE — ED Provider Notes (Signed)
[place]    CSN: [id number] Arrival date & time: 08/15/19  1015      History   Chief Complaint Chief Complaint  Patient presents with  . covid exposure    HPI Renee Webb is a 53 y.o. female.   Patient presents with request for COVID test.  She works at [employer] and received an email yesterday that a coworker's roommate is covered positive; she was instructed in the email to have a COVID test performed.  She denies symptoms, including fever, chills, cough, shortness of breath, vomiting, diarrhea.  The history is provided by the patient.    Past Medical History:  Diagnosis Date  . Allergy   . Anemia    resolved- most recent CBC 06/2018 showed no anemia    Patient Active Problem List   Diagnosis Date Noted  . Mixed stress and urge urinary incontinence 09/05/2018  . Seasonal allergic rhinitis 07/05/2017  . Seasonal allergies 04/21/2013    Past Surgical History:  Procedure Laterality Date  . ABDOMINAL HYSTERECTOMY    . COLONOSCOPY  02/04/2016  . MYOMECTOMY    . SEPTOPLASTY    . UTERINE FIBROID SURGERY      OB History   No obstetric history on file.      Home Medications    Prior to Admission medications   Medication Sig Start Date End Date Taking? Authorizing Provider  Ascorbic Acid (VITAMIN C) 500 MG CHEW Chew 1 tablet by mouth daily.    [provider]  fexofenadine (ALLEGRA) 180 MG tablet Take 180 mg by mouth daily.    [provider]  ibuprofen (ADVIL,MOTRIN) 200 MG tablet Take 1,000 mg by mouth every 6 (six) hours as needed. Reported on 02/04/2016    [provider]  montelukast (SINGULAIR) 10 MG tablet Take 10 mg by mouth at bedtime.    [provider]  Na Sulfate-K Sulfate-Mg Sulf SOLN Suprep (no substitutions)-TAKE AS DIRECTED. 01/19/16   Mauri Pole, MD    Family History Family History  Problem Relation Age of Onset  . Colon cancer Mother 4  . Hypertension Father     Social  History Social History   Tobacco Use  . Smoking status: Never Smoker  . Smokeless tobacco: Never Used  Substance Use Topics  . Alcohol use: No  . Drug use: No     Allergies   Acetaminophen and Other   Review of Systems Review of Systems  Constitutional: Negative for chills and fever.  HENT: Negative for congestion, ear pain, rhinorrhea and sore throat.   Eyes: Negative for pain and visual disturbance.  Respiratory: Negative for cough and shortness of breath.   Cardiovascular: Negative for chest pain and palpitations.  Gastrointestinal: Negative for abdominal pain, diarrhea and vomiting.  Genitourinary: Negative for dysuria and hematuria.  Musculoskeletal: Negative for arthralgias and back pain.  Skin: Negative for color change and rash.  Neurological: Negative for seizures and syncope.  All other systems reviewed and are negative.    Physical Exam Triage Vital Signs ED Triage Vitals [08/15/19 1035]  Enc Vitals Group     BP      Pulse      Resp      Temp      Temp src      SpO2      Weight      Height      Head Circumference      Peak Flow      Pain Score  0     Pain Loc      Pain Edu?      Excl. in [place]?    No data found.  Updated Vital Signs BP 132/88 (BP Location: Right Arm)   Pulse 73   Temp (!) 97.5 F (36.4 C) (Temporal)   Resp 17   SpO2 97%   Visual Acuity Right Eye Distance:   Left Eye Distance:   Bilateral Distance:    Right Eye Near:   Left Eye Near:    Bilateral Near:     Physical Exam Vitals signs and nursing note reviewed.  Constitutional:      General: She is not in acute distress.    Appearance: She is well-developed.  HENT:     Head: Normocephalic and atraumatic.     Right Ear: Tympanic membrane normal.     Left Ear: Tympanic membrane normal.     Nose: Nose normal.     Mouth/Throat:     Mouth: Mucous membranes are moist.     Pharynx: Oropharynx is clear.  Eyes:     Conjunctiva/sclera: Conjunctivae normal.  Neck:      Musculoskeletal: Neck supple.  Cardiovascular:     Rate and Rhythm: Normal rate and regular rhythm.     Heart sounds: No murmur.  Pulmonary:     Effort: Pulmonary effort is normal. No respiratory distress.     Breath sounds: Normal breath sounds.  Abdominal:     General: Bowel sounds are normal.     Palpations: Abdomen is soft.     Tenderness: There is no abdominal tenderness. There is no guarding or rebound.  Skin:    General: Skin is warm and dry.     Findings: No rash.  Neurological:     Mental Status: She is alert.      UC Treatments / Results  Labs (all labs ordered are listed, but only abnormal results are displayed) Labs Reviewed  NOVEL CORONAVIRUS, NAA (HOSP ORDER, SEND-OUT TO REF LAB; TAT 18-24 HRS)    EKG   Radiology No results found.  Procedures Procedures (including critical care time)  Medications Ordered in UC Medications - No data to display  Initial Impression / Assessment and Plan / UC Course  I have reviewed the triage vital signs and the nursing notes.  Pertinent labs & imaging results that were available during my care of the patient were reviewed by me and considered in my medical decision making (see chart for details).    Patient request for COVID test.  COVID test performed here.  Instructed patient to self quarantine until her test results are back.  Instructed patient to go to the emergency department if she develops high fever, shortness of breath, severe diarrhea, or other concerning symptoms.  Patient agrees with plan of care.     Final Clinical Impressions(s) / UC Diagnoses   Final diagnoses:  Patient request for diagnostic testing     Discharge Instructions     Your COVID test is pending.  You should self quarantine until your test result is back and is negative.    Go to the emergency department if you develop high fever, shortness of breath, severe diarrhea, or other concerning symptoms.       ED Prescriptions    None      Controlled Substance Prescriptions [place] Controlled Substance Registry consulted? Not Applicable   Sharion Balloon, NP 08/15/19 1102

## 2019-08-15 NOTE — Discharge Instructions (Addendum)
Your COVID test is pending.  You should self quarantine until your test result is back and is negative.   ° °Go to the emergency department if you develop high fever, shortness of breath, severe diarrhea, or other concerning symptoms.   ° °

## 2019-08-16 LAB — NOVEL CORONAVIRUS, NAA (HOSP ORDER, SEND-OUT TO REF LAB; TAT 18-24 HRS): SARS-CoV-2, NAA: NOT DETECTED

## 2019-08-17 ENCOUNTER — Encounter (HOSPITAL_COMMUNITY): Payer: Self-pay

## 2019-11-07 ENCOUNTER — Other Ambulatory Visit: Payer: Self-pay

## 2019-11-07 ENCOUNTER — Ambulatory Visit (HOSPITAL_COMMUNITY)
Admission: EM | Admit: 2019-11-07 | Discharge: 2019-11-07 | Disposition: A | Discharge status: Home / Self Care | Payer: BC Managed Care – PPO | Attending: Family Medicine | Admitting: Family Medicine

## 2019-11-07 ENCOUNTER — Encounter (HOSPITAL_COMMUNITY): Payer: Self-pay | Admitting: Emergency Medicine

## 2019-11-07 DIAGNOSIS — R0981 Nasal congestion: Secondary | ICD-10-CM | POA: Diagnosis not present

## 2019-11-07 DIAGNOSIS — Z8249 Family history of ischemic heart disease and other diseases of the circulatory system: Secondary | ICD-10-CM | POA: Insufficient documentation

## 2019-11-07 DIAGNOSIS — Z20828 Contact with and (suspected) exposure to other viral communicable diseases: Secondary | ICD-10-CM | POA: Diagnosis not present

## 2019-11-07 DIAGNOSIS — R0982 Postnasal drip: Secondary | ICD-10-CM | POA: Diagnosis not present

## 2019-11-07 NOTE — ED Provider Notes (Signed)
[place]   [id number] 11/07/19 Arrival Time: [id number]  ASSESSMENT & PLAN:  1. Nasal congestion     COVID testing sent. To self-quarantine until results are available. No known exposure.  Discussed typical duration of symptoms of a typical viral URI. OTC symptom care as needed. Ensure adequate fluid intake and rest. May f/u with PCP or here as needed.  Reviewed expectations re: course of current medical issues. Questions answered. Outlined signs and symptoms indicating need for more acute intervention. Patient verbalized understanding. After Visit Summary given.   SUBJECTIVE: History from: patient.  Renee Webb is a 53 y.o. female who presents with complaint of nasal congestion, post-nasal drainage; without sore throat. Onset abrupt, approx 5-6 d ago; without fatigue and without body aches. SOB: none. Wheezing: none. Fever: absent. Overall normal PO intake without n/v. Known sick contacts or COVID-19 exposure: no. No specific or significant aggravating or alleviating factors reported. OTC treatment: decongestant with mild relief.  Received flu shot this year: no.  Social History   Tobacco Use  Smoking Status Never Smoker  Smokeless Tobacco Never Used    ROS: As per HPI.   OBJECTIVE:  Vitals:   11/07/19 1455  BP: 124/84  Pulse: 71  Resp: 18  Temp: 98.4 F (36.9 C)  TempSrc: Oral  SpO2: 98%     General appearance: alert; appears fatigued HEENT: significant nasal congestion; clear runny nose Neck: supple without LAD CV: RRR Lungs: unlabored respirations, speaks full sentences without difficulty Abd: soft Ext: no LE edema Skin: warm and dry Psychological: alert and cooperative; normal mood and affect    Allergies  Allergen Reactions  . Acetaminophen Other (See Comments)    Other  . Other Other (See Comments)    "dyes in Triamin[place]"=face swelling    Past Medical History:  Diagnosis Date  . Allergy   . Anemia    resolved- most  recent CBC 06/2018 showed no anemia   Family History  Problem Relation Age of Onset  . Colon cancer Mother 31  . Hypertension Father    Social History   Socioeconomic History  . Marital status: Married    Spouse name: Not on file  . Number of children: Not on file  . Years of education: Not on file  . Highest education level: Not on file  Occupational History  . Not on file  Social Needs  . Financial resource strain: Not on file  . Food insecurity    Worry: Not on file    Inability: Not on file  . Transportation needs    Medical: Not on file    Non-medical: Not on file  Tobacco Use  . Smoking status: Never Smoker  . Smokeless tobacco: Never Used  Substance and Sexual Activity  . Alcohol use: No  . Drug use: No  . Sexual activity: Yes    Birth control/protection: None  Lifestyle  . Physical activity    Days per week: Not on file    Minutes per session: Not on file  . Stress: Not on file  Relationships  . Social Herbalist on phone: Not on file    Gets together: Not on file    Attends religious service: Not on file    Active member of club or organization: Not on file    Attends meetings of clubs or organizations: Not on file    Relationship status: Not on file  . Intimate partner violence    Fear of current or  ex partner: Not on file    Emotionally abused: Not on file    Physically abused: Not on file    Forced sexual activity: Not on file  Other Topics Concern  . Not on file  Social History Narrative  . Not on file           Vanessa Kick, MD 11/07/19 [phone]

## 2019-11-07 NOTE — ED Triage Notes (Signed)
Congested last Thursday or Friday.  History of allergies.  Monday, was very stuffy.  patient thad a virtual appt, prescribed nasal sprays.  This helps temporarily

## 2019-11-07 NOTE — Discharge Instructions (Addendum)
If your Covid-19 test is positive, you will receive a phone call from [facility] regarding your results. Negative test results are not called. Both positive and negative results area always visible on MyChart. If you do not have a MyChart account, sign up instructions are in your discharge papers.  

## 2019-11-09 LAB — NOVEL CORONAVIRUS, NAA (HOSP ORDER, SEND-OUT TO REF LAB; TAT 18-24 HRS): SARS-CoV-2, NAA: NOT DETECTED

## 2019-12-21 ENCOUNTER — Other Ambulatory Visit: Payer: Self-pay | Admitting: Obstetrics & Gynecology

## 2019-12-21 DIAGNOSIS — R928 Other abnormal and inconclusive findings on diagnostic imaging of breast: Secondary | ICD-10-CM

## 2019-12-27 ENCOUNTER — Ambulatory Visit
Admission: RE | Admit: 2019-12-27 | Discharge: 2019-12-27 | Disposition: A | Discharge status: Home / Self Care | Payer: BC Managed Care – PPO | Source: Ambulatory Visit | Attending: Obstetrics & Gynecology | Admitting: Obstetrics & Gynecology

## 2019-12-27 ENCOUNTER — Other Ambulatory Visit: Payer: Self-pay

## 2019-12-27 ENCOUNTER — Other Ambulatory Visit: Payer: Self-pay | Admitting: Obstetrics & Gynecology

## 2019-12-27 DIAGNOSIS — R928 Other abnormal and inconclusive findings on diagnostic imaging of breast: Secondary | ICD-10-CM

## 2019-12-27 DIAGNOSIS — R921 Mammographic calcification found on diagnostic imaging of breast: Secondary | ICD-10-CM

## 2019-12-28 ENCOUNTER — Ambulatory Visit
Admission: RE | Admit: 2019-12-28 | Discharge: 2019-12-28 | Disposition: A | Discharge status: Home / Self Care | Payer: BC Managed Care – PPO | Source: Ambulatory Visit | Attending: Obstetrics & Gynecology | Admitting: Obstetrics & Gynecology

## 2019-12-28 ENCOUNTER — Other Ambulatory Visit: Payer: Self-pay

## 2019-12-28 DIAGNOSIS — R921 Mammographic calcification found on diagnostic imaging of breast: Secondary | ICD-10-CM

## 2020-01-04 ENCOUNTER — Encounter: Payer: Self-pay | Admitting: Family Medicine

## 2020-01-29 ENCOUNTER — Encounter (HOSPITAL_COMMUNITY): Payer: Self-pay

## 2020-01-29 ENCOUNTER — Ambulatory Visit (HOSPITAL_COMMUNITY)
Admission: EM | Admit: 2020-01-29 | Discharge: 2020-01-29 | Disposition: A | Discharge status: Home / Self Care | Payer: BC Managed Care – PPO | Attending: Family Medicine | Admitting: Family Medicine

## 2020-01-29 ENCOUNTER — Other Ambulatory Visit: Payer: Self-pay

## 2020-01-29 DIAGNOSIS — S29011A Strain of muscle and tendon of front wall of thorax, initial encounter: Secondary | ICD-10-CM | POA: Diagnosis not present

## 2020-01-29 DIAGNOSIS — R0789 Other chest pain: Secondary | ICD-10-CM | POA: Diagnosis not present

## 2020-01-29 MED ORDER — NAPROXEN 500 MG PO TABS: 500.0000 mg | ORAL_TABLET | Freq: Two times a day (BID) | ORAL | 0 refills | Status: DC

## 2020-01-29 MED ORDER — CYCLOBENZAPRINE HCL 5 MG PO TABS: 5.0000 mg | ORAL_TABLET | Freq: Two times a day (BID) | ORAL | 0 refills | Status: DC | PRN

## 2020-01-29 NOTE — ED Triage Notes (Signed)
Pt present shoulder,back and breast pain. Symptoms started on Friday morning when she woke up. Pt denies any injury to these areas. Pt states that she was having trouble bending and extending her right shoulder.

## 2020-01-29 NOTE — Discharge Instructions (Addendum)
Please use naprosyn/naproxen twice daily with food for pain You may use flexeril as needed to help with pain. This is a muscle relaxer and causes sedation- please use only at bedtime or when you will be home and not have to drive/work  If developing more breast pain, please follow up with Breast center

## 2020-01-30 NOTE — ED Provider Notes (Signed)
[place]    CSN: [id number] Arrival date & time: 01/29/20  1240      History   Chief Complaint Chief Complaint  Patient presents with  . Muscle Pain    Shoulder, breast and back     HPI Renee Webb is a 54 y.o. female no significant past medical history presenting today for evaluation of shoulder and chest pain.  Patient states that Friday morning she woke up with right shoulder back and superior chest pain.  She denies any injury fall or trauma to this area.  Denies history of injury to shoulder.  Initially was having difficulty performing overhead tasks, but this is slightly improved as her\shoulder pain has eased off significantly.  Her main concern is the discomfort she is feeling in her chest.  She notes that in January she had biopsy to her breast with clip/marker placement, this healed well and has not had any issues, but is concerned of potentially this may be contributing to some of the discomfort she is feeling.  She denies fevers change in color or noting any abnormality within breast tissue.  She has been using lidocaine patches and taking Tylenol.  HPI  Past Medical History:  Diagnosis Date  . Allergy   . Anemia    resolved- most recent CBC 06/2018 showed no anemia    Patient Active Problem List   Diagnosis Date Noted  . Mixed stress and urge urinary incontinence 09/05/2018  . Seasonal allergic rhinitis 07/05/2017  . Seasonal allergies 04/21/2013    Past Surgical History:  Procedure Laterality Date  . ABDOMINAL HYSTERECTOMY    . COLONOSCOPY  02/04/2016  . MYOMECTOMY    . SEPTOPLASTY    . UTERINE FIBROID SURGERY      OB History   No obstetric history on file.      Home Medications    Prior to Admission medications   Medication Sig Start Date End Date Taking? Authorizing Provider  Ascorbic Acid (VITAMIN C) 500 MG CHEW Chew 1 tablet by mouth daily.    [provider]  cyclobenzaprine (FLEXERIL) 5 MG tablet Take 1-2 tablets  (5-10 mg total) by mouth 2 (two) times daily as needed for muscle spasms. 01/29/20   Reema Chick C, PA-C  DIAZEPAM PO Take by mouth.    [provider]  fexofenadine (ALLEGRA) 180 MG tablet Take 180 mg by mouth daily.    [provider]  ibuprofen (ADVIL,MOTRIN) 200 MG tablet Take 1,000 mg by mouth every 6 (six) hours as needed. Reported on 02/04/2016    [provider]  montelukast (SINGULAIR) 10 MG tablet Take 10 mg by mouth at bedtime.    [provider]  Na Sulfate-K Sulfate-Mg Sulf SOLN Suprep (no substitutions)-TAKE AS DIRECTED. 01/19/16   Mauri Pole, MD  naproxen (NAPROSYN) 500 MG tablet Take 1 tablet (500 mg total) by mouth 2 (two) times daily. 01/29/20   Halana Deisher C, PA-C  SERTRALINE HCL PO Take by mouth.    [provider]    Family History Family History  Problem Relation Age of Onset  . Colon cancer Mother 60  . Hypertension Father     Social History Social History   Tobacco Use  . Smoking status: Never Smoker  . Smokeless tobacco: Never Used  Substance Use Topics  . Alcohol use: No  . Drug use: No     Allergies   Acetaminophen and Other   Review of Systems Review of Systems  Constitutional: Negative  for fatigue and fever.  Eyes: Negative for visual disturbance.  Respiratory: Negative for shortness of breath.   Cardiovascular: Positive for chest pain.  Gastrointestinal: Negative for abdominal pain, nausea and vomiting.  Musculoskeletal: Positive for myalgias. Negative for arthralgias and joint swelling.  Skin: Negative for color change, rash and wound.  Neurological: Negative for dizziness, weakness, light-headedness and headaches.     Physical Exam Triage Vital Signs ED Triage Vitals  Enc Vitals Group     BP 01/29/20 1258 126/85     Pulse Rate 01/29/20 1258 71     Resp 01/29/20 1258 16     Temp 01/29/20 1258 98.3 F (36.8 C)     Temp Source 01/29/20 1258 Oral     SpO2 01/29/20 1258 100 %      Weight --      Height --      Head Circumference --      Peak Flow --      Pain Score 01/29/20 1259 7     Pain Loc --      Pain Edu? --      Excl. in [place]? --    No data found.  Updated Vital Signs BP 126/85 (BP Location: Right Arm)   Pulse 71   Temp 98.3 F (36.8 C) (Oral)   Resp 16   SpO2 100%   Visual Acuity Right Eye Distance:   Left Eye Distance:   Bilateral Distance:    Right Eye Near:   Left Eye Near:    Bilateral Near:     Physical Exam Vitals and nursing note reviewed.  Constitutional:      Appearance: She is well-developed.     Comments: No acute distress  HENT:     Head: Normocephalic and atraumatic.     Nose: Nose normal.  Eyes:     Conjunctiva/sclera: Conjunctivae normal.  Cardiovascular:     Rate and Rhythm: Normal rate.  Pulmonary:     Effort: Pulmonary effort is normal. No respiratory distress.     Comments: Breathing comfortably at rest, CTABL, no wheezing, rales or other adventitious sounds auscultated  Abdominal:     General: There is no distension.  Musculoskeletal:        General: Normal range of motion.     Cervical back: Neck supple.     Comments: Right shoulder: Nontender to palpation along clavicle, AC joint and scapular spine, minimal tenderness throughout periscapular musculature and superior trapezius, significant tenderness throughout anterior right chest/pectoral area, increases towards breast; no left-sided chest tenderness Full active range of motion of shoulder  Skin:    General: Skin is warm and dry.     Comments: Healed wounds from biopsy noted to left and right upper quadrants of breast, no surrounding erythema, drainage or induration  No palpable abnormality or mass appreciated within right breast  Neurological:     Mental Status: She is alert and oriented to person, place, and time.      UC Treatments / Results  Labs (all labs ordered are listed, but only abnormal results are displayed) Labs Reviewed - No data to  display  EKG   Radiology No results found.  Procedures Procedures (including critical care time)  Medications Ordered in UC Medications - No data to display  Initial Impression / Assessment and Plan / UC Course  I have reviewed the triage vital signs and the nursing notes.  Pertinent labs & imaging results that were available during my care of the patient were reviewed  by me and considered in my medical decision making (see chart for details).     Most likely MSK cause given association with shoulder and back pain initially, suspecting more pectoralis strain at this time.  Recommending anti-inflammatories and warm compresses.  No sign of infection or breast abscess at this time.  Advised patient continue to monitor pain, if pain persisting within the breast to follow-up with breast center.  Discussed strict return precautions. Patient verbalized understanding and is agreeable with plan.  Final Clinical Impressions(s) / UC Diagnoses   Final diagnoses:  Chest wall pain  Pectoralis muscle strain, initial encounter     Discharge Instructions     Please use naprosyn/naproxen twice daily with food for pain You may use flexeril as needed to help with pain. This is a muscle relaxer and causes sedation- please use only at bedtime or when you will be home and not have to drive/work  If developing more breast pain, please follow up with Breast center    ED Prescriptions    Medication Sig Dispense Auth. Provider   naproxen (NAPROSYN) 500 MG tablet Take 1 tablet (500 mg total) by mouth 2 (two) times daily. 30 tablet Abhijot Straughter C, PA-C   cyclobenzaprine (FLEXERIL) 5 MG tablet Take 1-2 tablets (5-10 mg total) by mouth 2 (two) times daily as needed for muscle spasms. 24 tablet Deontaye Civello, [place] C, PA-C     PDMP not reviewed this encounter.   Janith Lima, PA-C 01/30/20 1039

## 2020-02-01 ENCOUNTER — Ambulatory Visit: Payer: BC Managed Care – PPO | Attending: Internal Medicine

## 2020-02-01 DIAGNOSIS — Z23 Encounter for immunization: Secondary | ICD-10-CM | POA: Insufficient documentation

## 2020-02-01 NOTE — Progress Notes (Signed)
   Covid-19 Vaccination Clinic  Name:  Renee Webb    MRN: [id number] DOB: 08/09/1966  02/01/2020  Renee Webb was observed post Covid-19 immunization for 15 minutes without incident. She was provided with Vaccine Information Sheet and instruction to access the V-Safe system.   Renee Webb was instructed to call 911 with any severe reactions post vaccine: Marland Kitchen Difficulty breathing  . Swelling of face and throat  . A fast heartbeat  . A bad rash all over body  . Dizziness and weakness

## 2020-02-27 ENCOUNTER — Ambulatory Visit: Payer: BC Managed Care – PPO | Attending: Internal Medicine

## 2020-02-27 DIAGNOSIS — Z23 Encounter for immunization: Secondary | ICD-10-CM

## 2020-02-27 NOTE — Progress Notes (Signed)
   Covid-19 Vaccination Clinic  Name:  Renee Webb    MRN: [id number] DOB: [birth year]  02/27/2020  [name] was observed post Covid-19 immunization for 15 minutes without incident. She was provided with Vaccine Information Sheet and instruction to access the V-Safe system.   [name] was instructed to call 911 with any severe reactions post vaccine: Marland Kitchen Difficulty breathing  . Swelling of face and throat  . A fast heartbeat  . A bad rash all over body  . Dizziness and weakness   Immunizations Administered    Name Date Dose VIS Date Route   Pfizer COVID-19 Vaccine 02/27/2020 11:18 AM 0.3 mL 11/09/2019 Intramuscular   Manufacturer: [place]   Lot: [id number]   [place]: [id number]

## 2020-05-05 ENCOUNTER — Ambulatory Visit (HOSPITAL_COMMUNITY)
Admission: EM | Admit: 2020-05-05 | Discharge: 2020-05-05 | Disposition: A | Discharge status: Home / Self Care | Payer: BC Managed Care – PPO | Attending: Family Medicine | Admitting: Family Medicine

## 2020-05-05 ENCOUNTER — Other Ambulatory Visit: Payer: Self-pay

## 2020-05-05 ENCOUNTER — Encounter (HOSPITAL_COMMUNITY): Payer: Self-pay | Admitting: Emergency Medicine

## 2020-05-05 DIAGNOSIS — S2341XA Sprain of ribs, initial encounter: Secondary | ICD-10-CM

## 2020-05-05 DIAGNOSIS — M940 Chondrocostal junction syndrome [Tietze]: Secondary | ICD-10-CM

## 2020-05-05 MED ORDER — HYDROCODONE-ACETAMINOPHEN 5-325 MG PO TABS: 1.0000 | ORAL_TABLET | Freq: Four times a day (QID) | ORAL | 0 refills | Status: DC | PRN

## 2020-05-05 MED ORDER — METHYLPREDNISOLONE 4 MG PO TBPK: ORAL_TABLET | ORAL | 0 refills | Status: DC

## 2020-05-05 NOTE — ED Triage Notes (Signed)
Pt sts left mid to lower back and side pain after twisting yesterday; pt sts pain worse with movement

## 2020-05-05 NOTE — Discharge Instructions (Signed)
Reduce your activity.  Get plenty of rest Take the Medrol Dosepak as directed.  This is a steroid medicine in it that is a very potent anti-inflammatory.  Take all of day 1 today. Take the pain medication as needed for severe pain.  I have prescribed acetaminophen with hydrocodone.  Do not take and drive. Contact your family doctor if not improving in a few days

## 2020-05-05 NOTE — ED Provider Notes (Signed)
[place]    CSN: [id number] Arrival date & time: 05/05/20  1359      History   Chief Complaint Chief Complaint  Patient presents with  . Back Pain    HPI Renee Webb is a 54 y.o. female.   HPI   Patient states that she has left flank pain.  She states that yesterday while out in the rain she was trying to keep brain from coming in a doorway, managing an umbrella, and had a sudden twisting motion.  This caused a wrenching movement and pain in her left lower ribs in the midaxillary region.  No swelling.  No crepitus.  No popping.  No shortness of breath.  It is worse with movement.  It is worse with deep breath, cough, sneeze.  Has never had rib problems before.  No osteoporosis or problems with bones.  Past Medical History:  Diagnosis Date  . Allergy   . Anemia    resolved- most recent CBC 06/2018 showed no anemia    Patient Active Problem List   Diagnosis Date Noted  . Mixed stress and urge urinary incontinence 09/05/2018  . Seasonal allergic rhinitis 07/05/2017  . Seasonal allergies 04/21/2013    Past Surgical History:  Procedure Laterality Date  . ABDOMINAL HYSTERECTOMY    . COLONOSCOPY  02/04/2016  . MYOMECTOMY    . SEPTOPLASTY    . UTERINE FIBROID SURGERY      OB History   No obstetric history on file.      Home Medications    Prior to Admission medications   Medication Sig Start Date End Date Taking? Authorizing Provider  Ascorbic Acid (VITAMIN C) 500 MG CHEW Chew 1 tablet by mouth daily.    [provider]  DIAZEPAM PO Take by mouth.    [provider]  fexofenadine (ALLEGRA) 180 MG tablet Take 180 mg by mouth daily.    [provider]  HYDROcodone-acetaminophen (NORCO/VICODIN) 5-325 MG tablet Take 1-2 tablets by mouth every 6 (six) hours as needed. 05/05/20   Raylene Everts, MD  methylPREDNISolone (MEDROL DOSEPAK) 4 MG TBPK tablet tad 05/05/20   Raylene Everts, MD  montelukast (SINGULAIR) 10 MG  tablet Take 10 mg by mouth at bedtime.    [provider]  naproxen (NAPROSYN) 500 MG tablet Take 1 tablet (500 mg total) by mouth 2 (two) times daily. 01/29/20   Wieters, Hallie C, PA-C  SERTRALINE HCL PO Take by mouth.    [provider]    Family History Family History  Problem Relation Age of Onset  . Colon cancer Mother 1  . Hypertension Father     Social History Social History   Tobacco Use  . Smoking status: Never Smoker  . Smokeless tobacco: Never Used  Substance Use Topics  . Alcohol use: No  . Drug use: No     Allergies   Other   Review of Systems Review of Systems  Genitourinary: Positive for flank pain.     Physical Exam Triage Vital Signs ED Triage Vitals [05/05/20 1450]  Enc Vitals Group     BP 115/82     Pulse Rate 75     Resp 18     Temp 98.4 F (36.9 C)     Temp Source Oral     SpO2 100 %     Weight      Height      Head Circumference      Peak Flow  Pain Score 8     Pain Loc      Pain Edu?      Excl. in [place]?    No data found.  Updated Vital Signs BP 115/82 (BP Location: Right Arm)   Pulse 75   Temp 98.4 F (36.9 C) (Oral)   Resp 18   SpO2 100%  Physical Exam Constitutional:      General: She is not in acute distress.    Appearance: She is well-developed and normal weight.     Comments: Appears uncomfortable  HENT:     Head: Normocephalic and atraumatic.     Nose:     Comments: Mask is in place Eyes:     Conjunctiva/sclera: Conjunctivae normal.     Pupils: Pupils are equal, round, and reactive to light.  Cardiovascular:     Rate and Rhythm: Normal rate.  Pulmonary:     Effort: Pulmonary effort is normal. No respiratory distress.    Chest:       Comments: Tenderness is at the lower rib border in the midaxillary line on the left.  No crepitus.  No foot deformity.  Tender to even moderate palpation.  Lungs are clear Abdominal:     General: There is no distension.     Palpations: Abdomen is soft.    Musculoskeletal:        General: Normal range of motion.     Cervical back: Normal range of motion.  Skin:    General: Skin is warm and dry.  Neurological:     Mental Status: She is alert.      UC Treatments / Results  Labs (all labs ordered are listed, but only abnormal results are displayed) Labs Reviewed - No data to display  EKG   Radiology No results found.  Procedures Procedures (including critical care time)  Medications Ordered in UC Medications - No data to display  Initial Impression / Assessment and Plan / UC Course  I have reviewed the triage vital signs and the nursing notes.  Pertinent labs & imaging results that were available during my care of the patient were reviewed by me and considered in my medical decision making (see chart for details).     Reviewed costochondritis/rib injury.  No need for x-ray based on my physical examination and her historical injury.  Expect improvement with conservative management Final Clinical Impressions(s) / UC Diagnoses   Final diagnoses:  Sprain of costal cartilage, initial encounter  Costochondritis     Discharge Instructions     Reduce your activity.  Get plenty of rest Take the Medrol Dosepak as directed.  This is a steroid medicine in it that is a very potent anti-inflammatory.  Take all of day 1 today. Take the pain medication as needed for severe pain.  I have prescribed acetaminophen with hydrocodone.  Do not take and drive. Contact your family doctor if not improving in a few days    ED Prescriptions    Medication Sig Dispense Auth. Provider   methylPREDNISolone (MEDROL DOSEPAK) 4 MG TBPK tablet tad 21 tablet Raylene Everts, MD   HYDROcodone-acetaminophen (NORCO/VICODIN) 5-325 MG tablet Take 1-2 tablets by mouth every 6 (six) hours as needed. 10 tablet Raylene Everts, MD     I have reviewed the PDMP during this encounter.   Raylene Everts, MD 05/05/20 [phone]

## 2020-08-11 ENCOUNTER — Other Ambulatory Visit: Payer: Self-pay

## 2020-08-11 ENCOUNTER — Ambulatory Visit (HOSPITAL_COMMUNITY)
Admission: EM | Admit: 2020-08-11 | Discharge: 2020-08-11 | Disposition: A | Discharge status: Home / Self Care | Payer: BC Managed Care – PPO | Attending: Family Medicine | Admitting: Family Medicine

## 2020-08-11 ENCOUNTER — Encounter (HOSPITAL_COMMUNITY): Payer: Self-pay | Admitting: Emergency Medicine

## 2020-08-11 DIAGNOSIS — R220 Localized swelling, mass and lump, head: Secondary | ICD-10-CM | POA: Diagnosis not present

## 2020-08-11 NOTE — ED Triage Notes (Signed)
noticed knot on scalp yesterday.  Swollen area to top, front of head.  No known injury

## 2020-08-11 NOTE — ED Provider Notes (Addendum)
[place]   [id number] 08/11/20 Arrival Time: 2993  ASSESSMENT & PLAN:  1. Scalp mass     Question if developing cyst vs lipoma. No signs of infection. Discussed. Comfortable with observation.   Follow-up Information    Antony Blackbird, MD.   Specialty: Family Medicine Why: If worsening or failing to improve as anticipated. Contact information: [place] [address] [phone]               Reviewed expectations re: course of current medical issues. Questions answered. Outlined signs and symptoms indicating need for more acute intervention. Patient verbalized understanding. After Visit Summary given.   SUBJECTIVE:  Renee Webb is a 54 y.o. female who presents with a skin complaint. "Noticed a bump on my head this morning" while brushing hair. No specific pain. No drainage or bleeding. No injury. Otherwise well.    OBJECTIVE: Vitals:   08/11/20 1217  BP: 127/84  Pulse: 66  Resp: 18  Temp: 98.5 F (36.9 C)  TempSrc: Oral  SpO2: 97%    General appearance: alert; no distress HEENT: [place]; AT Neck: supple with FROM Skin: warm and dry; left frontal scalp with approx 1 cm area of slightly elevated skin/soft mass without overlying erythema Psychological: alert and cooperative; normal mood and affect  Allergies  Allergen Reactions  . Other Other (See Comments)    "dyes in Triamin[place]"=face swelling    Past Medical History:  Diagnosis Date  . Allergy   . Anemia    resolved- most recent CBC 06/2018 showed no anemia   Social History   Socioeconomic History  . Marital status: Married    Spouse name: Not on file  . Number of children: Not on file  . Years of education: Not on file  . Highest education level: Not on file  Occupational History  . Not on file  Tobacco Use  . Smoking status: Never Smoker  . Smokeless tobacco: Never Used  Substance and Sexual Activity  . Alcohol use: No  . Drug use: No  . Sexual  activity: Yes    Birth control/protection: None  Other Topics Concern  . Not on file  Social History Narrative  . Not on file   Social Determinants of Health   Financial Resource Strain:   . Difficulty of Paying Living Expenses: Not on file  Food Insecurity:   . Worried About Charity fundraiser in the Last Year: Not on file  . Ran Out of Food in the Last Year: Not on file  Transportation Needs:   . Lack of Transportation (Medical): Not on file  . Lack of Transportation (Non-Medical): Not on file  Physical Activity:   . Days of Exercise per Week: Not on file  . Minutes of Exercise per Session: Not on file  Stress:   . Feeling of Stress : Not on file  Social Connections:   . Frequency of Communication with Friends and Family: Not on file  . Frequency of Social Gatherings with Friends and Family: Not on file  . Attends Religious Services: Not on file  . Active Member of Clubs or Organizations: Not on file  . Attends Archivist Meetings: Not on file  . Marital Status: Not on file  Intimate Partner Violence:   . Fear of Current or Ex-Partner: Not on file  . Emotionally Abused: Not on file  . Physically Abused: Not on file  . Sexually Abused: Not on file   Family History  Problem Relation Age of Onset  . Colon cancer Mother 104  . Hypertension Father    Past Surgical History:  Procedure Laterality Date  . ABDOMINAL HYSTERECTOMY    . COLONOSCOPY  02/04/2016  . MYOMECTOMY    . SEPTOPLASTY    . UTERINE FIBROID SURGERY       Vanessa Kick, MD 08/11/20 [place], MD 08/11/20 1251

## 2020-10-27 HISTORY — PX: BREAST BIOPSY: SHX20

## 2020-12-21 ENCOUNTER — Ambulatory Visit (HOSPITAL_COMMUNITY)
Admission: EM | Admit: 2020-12-21 | Discharge: 2020-12-21 | Disposition: A | Discharge status: Home / Self Care | Payer: BC Managed Care – PPO | Attending: Emergency Medicine | Admitting: Emergency Medicine

## 2020-12-21 ENCOUNTER — Other Ambulatory Visit: Payer: Self-pay

## 2020-12-21 ENCOUNTER — Encounter (HOSPITAL_COMMUNITY): Payer: Self-pay

## 2020-12-21 ENCOUNTER — Ambulatory Visit (INDEPENDENT_AMBULATORY_CARE_PROVIDER_SITE_OTHER): Payer: BC Managed Care – PPO

## 2020-12-21 ENCOUNTER — Ambulatory Visit (HOSPITAL_COMMUNITY): Payer: BC Managed Care – PPO

## 2020-12-21 DIAGNOSIS — Z23 Encounter for immunization: Secondary | ICD-10-CM | POA: Diagnosis not present

## 2020-12-21 DIAGNOSIS — S6981XA Other specified injuries of right wrist, hand and finger(s), initial encounter: Secondary | ICD-10-CM | POA: Diagnosis not present

## 2020-12-21 DIAGNOSIS — S60459A Superficial foreign body of unspecified finger, initial encounter: Secondary | ICD-10-CM

## 2020-12-21 MED ORDER — AMOXICILLIN-POT CLAVULANATE 875-125 MG PO TABS: 1.0000 | ORAL_TABLET | Freq: Two times a day (BID) | ORAL | 0 refills | Status: DC

## 2020-12-21 MED ORDER — TETANUS-DIPHTH-ACELL PERTUSSIS 5-2.5-18.5 LF-MCG/0.5 IM SUSY
0.5000 mL | PREFILLED_SYRINGE | Freq: Once | INTRAMUSCULAR | Status: AC
  Administered 2020-12-21: 0.5 mL via INTRAMUSCULAR

## 2020-12-21 MED ORDER — TETANUS-DIPHTH-ACELL PERTUSSIS 5-2.5-18.5 LF-MCG/0.5 IM SUSY
PREFILLED_SYRINGE | INTRAMUSCULAR | Status: AC
  Filled 2020-12-21: qty 0.5

## 2020-12-21 NOTE — ED Triage Notes (Signed)
Pt presents with pain in right ring finger 1 day. Pt think she may have a pice of glass in the right ring finger.

## 2020-12-21 NOTE — Discharge Instructions (Signed)
Soak in warm water regularly to keep skin soft.  Hydrogen peroxide may be helpful as well.  I have sent some prophylactic antibiotics as well to prevent infection of the skin.  Please return for worsening, particularly signs of infection- redness, increased pain, firm or hard to touch or otherwise worsening.

## 2020-12-21 NOTE — ED Provider Notes (Signed)
[place]    CSN: [id number] Arrival date & time: 12/21/20  1711      History   Chief Complaint Chief Complaint  Patient presents with  . Foreign Body    HPI Renee Webb is a 55 y.o. female.   Renee Webb presents with complaints of pain to the distal tuft of her right ring finger. Yesterday a glass light fixture broke, while cleaning the area afterward, she was using a wipe and felt a cut to the finger with small bleeding. She has used hydrogen peroxide to the area. It is more tender than she would expect with just a simple cut, therefore she is concerned that there is a small piece of glass imbedding in the soft tissue of her finger.    ROS per HPI, negative if not otherwise mentioned.      Past Medical History:  Diagnosis Date  . Allergy   . Anemia    resolved- most recent CBC 06/2018 showed no anemia    Patient Active Problem List   Diagnosis Date Noted  . Mixed stress and urge urinary incontinence 09/05/2018  . Seasonal allergic rhinitis 07/05/2017  . Seasonal allergies 04/21/2013    Past Surgical History:  Procedure Laterality Date  . ABDOMINAL HYSTERECTOMY    . COLONOSCOPY  02/04/2016  . MYOMECTOMY    . SEPTOPLASTY    . UTERINE FIBROID SURGERY      OB History   No obstetric history on file.      Home Medications    Prior to Admission medications   Medication Sig Start Date End Date Taking? Authorizing Provider  amoxicillin-clavulanate (AUGMENTIN) 875-125 MG tablet Take 1 tablet by mouth every 12 (twelve) hours. 12/21/20  Yes Boruch Manuele, Lanelle Bal B, NP  Ascorbic Acid (VITAMIN C) 500 MG CHEW Chew 1 tablet by mouth daily.    [provider]  Aspirin-Caffeine (BAYER BACK & BODY PO) Take by mouth.    [provider]  B Complex Vitamins (VITAMIN B COMPLEX) TABS Take 1 tablet by mouth daily.    [provider]  ELDERBERRY PO Take by mouth.    [provider]  fexofenadine (ALLEGRA) 180 MG tablet  Take 180 mg by mouth daily.    [provider]  montelukast (SINGULAIR) 10 MG tablet Take 10 mg by mouth at bedtime.    [provider]  SERTRALINE HCL PO Take by mouth.  08/11/20  [provider]    Family History Family History  Problem Relation Age of Onset  . Colon cancer Mother 55  . Hypertension Father     Social History Social History   Tobacco Use  . Smoking status: Never Smoker  . Smokeless tobacco: Never Used  Substance Use Topics  . Alcohol use: No  . Drug use: No     Allergies   Other, Red dye, and Sulfa antibiotics   Review of Systems Review of Systems   Physical Exam Triage Vital Signs ED Triage Vitals  Enc Vitals Group     BP 12/21/20 1750 122/78     Pulse Rate 12/21/20 1750 79     Resp 12/21/20 1750 18     Temp 12/21/20 1750 98.5 F (36.9 C)     Temp Source 12/21/20 1750 Oral     SpO2 12/21/20 1750 96 %     Weight --      Height --      Head Circumference --      Peak Flow --  Pain Score 12/21/20 1748 5     Pain Loc --      Pain Edu? --      Excl. in [place]? --    No data found.  Updated Vital Signs BP 122/78 (BP Location: Left Arm)   Pulse 79   Temp 98.5 F (36.9 C) (Oral)   Resp 18   SpO2 96%   Visual Acuity Right Eye Distance:   Left Eye Distance:   Bilateral Distance:    Right Eye Near:   Left Eye Near:    Bilateral Near:     Physical Exam Constitutional:      General: She is not in acute distress.    Appearance: She is well-developed.  Cardiovascular:     Rate and Rhythm: Normal rate.  Pulmonary:     Effort: Pulmonary effort is normal.  Skin:    General: Skin is warm and dry.     Comments: Tuft of right hand ring finger with pinpoint entry point visible without visible or palpable f/b; full ROM of finger; mildly tender over entry point; no redness or drainage; mild bruising noted  Neurological:     Mental Status: She is alert and oriented to person, place, and time.      UC  Treatments / Results  Labs (all labs ordered are listed, but only abnormal results are displayed) Labs Reviewed - No data to display  EKG   Radiology DG Finger Ring Right  Result Date: 12/21/2020 CLINICAL DATA:  Rule out foreign body.  Glass in tuft of finger. EXAM: RIGHT RING FINGER 2+V COMPARISON:  None. FINDINGS: Three views centered about the ring finger of the right hand demonstrate no fracture or dislocation. A linear radiopaque density measures 3 mm within the tuft of the finger on the lateral view. IMPRESSION: Radiopaque 3 mm foreign body within the tuft of the ring finger of the right hand. Electronically Signed   By: Abigail Miyamoto M.D.   On: 12/21/2020 18:27    Procedures Procedures (including critical care time)  Medications Ordered in UC Medications  Tdap (BOOSTRIX) injection 0.5 mL (0.5 mLs Intramuscular Given 12/21/20 1844)    Initial Impression / Assessment and Plan / UC Course  I have reviewed the triage vital signs and the nursing notes.  Pertinent labs & imaging results that were available during my care of the patient were reviewed by me and considered in my medical decision making (see chart for details).     Glass visualized to right hand ring finger tuft, which occurred yesterday. Unable to visualize this, but entry point is visible and remains open. Discussed options for attempt to remove this, patient declines tonight and wishes to try less invasive option at this time. She will soak finger regularly to keep skin soft, hydrogen peroxide to entry point, milking of the region to try to promote it's extrication. Antibiotics provided to prevent risk of felon. Strict return precautions provided as well. Patient verbalized understanding and agreeable to plan.   Final Clinical Impressions(s) / UC Diagnoses   Final diagnoses:  Foreign body in skin of finger, initial encounter     Discharge Instructions     Soak in warm water regularly to keep skin soft.   Hydrogen peroxide may be helpful as well.  I have sent some prophylactic antibiotics as well to prevent infection of the skin.  Please return for worsening, particularly signs of infection- redness, increased pain, firm or hard to touch or otherwise worsening.  ED Prescriptions    Medication Sig Dispense Auth. Provider   amoxicillin-clavulanate (AUGMENTIN) 875-125 MG tablet Take 1 tablet by mouth every 12 (twelve) hours. 14 tablet Zigmund Gottron, NP     PDMP not reviewed this encounter.   Zigmund Gottron, NP 12/21/20 [phone]

## 2021-01-01 ENCOUNTER — Other Ambulatory Visit: Payer: Self-pay

## 2021-01-01 ENCOUNTER — Ambulatory Visit (HOSPITAL_COMMUNITY)
Admission: EM | Admit: 2021-01-01 | Discharge: 2021-01-01 | Disposition: A | Discharge status: Home / Self Care | Payer: BC Managed Care – PPO

## 2021-01-01 ENCOUNTER — Encounter (HOSPITAL_COMMUNITY): Payer: Self-pay

## 2021-01-01 DIAGNOSIS — S60459D Superficial foreign body of unspecified finger, subsequent encounter: Secondary | ICD-10-CM

## 2021-01-01 MED ORDER — DOXYCYCLINE HYCLATE 100 MG PO CAPS: 100.0000 mg | ORAL_CAPSULE | Freq: Two times a day (BID) | ORAL | 0 refills | Status: DC

## 2021-01-01 NOTE — ED Provider Notes (Signed)
[place]    CSN: [id number] Arrival date & time: 01/01/21  1130      History   Chief Complaint Chief Complaint  Patient presents with  . glass in finger    HPI Renee Webb is a 55 y.o. female.   Patient presents with glass in right pointer finger. Seen on 1/23 for same complaint. Declined extraction at that time. Completed antibiotic course and attempted warm soaks for removal to aid with no avail   Past Medical History:  Diagnosis Date  . Allergy   . Anemia    resolved- most recent CBC 06/2018 showed no anemia    Patient Active Problem List   Diagnosis Date Noted  . Mixed stress and urge urinary incontinence 09/05/2018  . Seasonal allergic rhinitis 07/05/2017  . Seasonal allergies 04/21/2013    Past Surgical History:  Procedure Laterality Date  . ABDOMINAL HYSTERECTOMY    . COLONOSCOPY  02/04/2016  . MYOMECTOMY    . SEPTOPLASTY    . UTERINE FIBROID SURGERY      OB History   No obstetric history on file.      Home Medications    Prior to Admission medications   Medication Sig Start Date End Date Taking? Authorizing Provider  doxycycline (VIBRAMYCIN) 100 MG capsule Take 1 capsule (100 mg total) by mouth 2 (two) times daily. 01/01/21  Yes Hans Eden, NP  amoxicillin-clavulanate (AUGMENTIN) 875-125 MG tablet Take 1 tablet by mouth every 12 (twelve) hours. 12/21/20   Zigmund Gottron, NP  Ascorbic Acid (VITAMIN C) 500 MG CHEW Chew 1 tablet by mouth daily.    [provider]  Aspirin-Caffeine (BAYER BACK & BODY PO) Take by mouth.    [provider]  B Complex Vitamins (VITAMIN B COMPLEX) TABS Take 1 tablet by mouth daily.    [provider]  ELDERBERRY PO Take by mouth.    [provider]  fexofenadine (ALLEGRA) 180 MG tablet Take 180 mg by mouth daily.    [provider]  montelukast (SINGULAIR) 10 MG tablet Take 10 mg by mouth at bedtime.    [provider]  SERTRALINE HCL PO Take  by mouth.  08/11/20  [provider]    Family History Family History  Problem Relation Age of Onset  . Colon cancer Mother 61  . Hypertension Father     Social History Social History   Tobacco Use  . Smoking status: Never Smoker  . Smokeless tobacco: Never Used  Substance Use Topics  . Alcohol use: No  . Drug use: No     Allergies   Other, Red dye, and Sulfa antibiotics   Review of Systems Review of Systems  Constitutional: Negative.   HENT: Negative.   Respiratory: Negative.   Cardiovascular: Negative.   Gastrointestinal: Negative.   Genitourinary: Negative.   Musculoskeletal: Negative.   Skin: Positive for wound.  Neurological: Negative.   Psychiatric/Behavioral: Negative.      Physical Exam Triage Vital Signs ED Triage Vitals  Enc Vitals Group     BP 01/01/21 1256 (!) 126/91     Pulse Rate 01/01/21 1256 80     Resp 01/01/21 1256 18     Temp 01/01/21 1256 98.2 F (36.8 C)     Temp src --      SpO2 01/01/21 1256 100 %     Weight --      Height --      Head Circumference --  Peak Flow --      Pain Score 01/01/21 1255 0     Pain Loc --      Pain Edu? --      Excl. in [place]? --    No data found.  Updated Vital Signs BP (!) 126/91   Pulse 80   Temp 98.2 F (36.8 C)   Resp 18   SpO2 100%   Visual Acuity Right Eye Distance:   Left Eye Distance:   Bilateral Distance:    Right Eye Near:   Left Eye Near:    Bilateral Near:     Physical Exam Constitutional:      Appearance: Normal appearance. She is normal weight.  HENT:     Head: Normocephalic.  Eyes:     Extraocular Movements: Extraocular movements intact.  Pulmonary:     Effort: Pulmonary effort is normal.  Musculoskeletal:        General: Normal range of motion.     Left hand: Normal.     Cervical back: Normal range of motion.     Comments: Mild swelling and tenderness over proximal phalanx of right index finger, ROM intact   Skin:    General: Skin is warm and dry.   Neurological:     General: No focal deficit present.     Mental Status: She is alert and oriented to person, place, and time.  Psychiatric:        Mood and Affect: Mood normal.        Behavior: Behavior normal.        Thought Content: Thought content normal.        Judgment: Judgment normal.      UC Treatments / Results  Labs (all labs ordered are listed, but only abnormal results are displayed) Labs Reviewed - No data to display  EKG   Radiology No results found.  Procedures Procedures (including critical care time)  Medications Ordered in UC Medications - No data to display  Initial Impression / Assessment and Plan / UC Course  I have reviewed the triage vital signs and the nursing notes.  Pertinent labs & imaging results that were available during my care of the patient were reviewed by me and considered in my medical decision making (see chart for details).  Foreign body in skin of finger, follow up visit  1. Shard of glass extracted by Lamptey MD   2. Doxycyline 100 mg bid for 7 days  3. Cleanse site with diluted soap and water twice a day  Final Clinical Impressions(s) / UC Diagnoses   Final diagnoses:  Foreign body in skin of finger, subsequent encounter     Discharge Instructions     Take antibiotics twice daily Can cleanse area with diluted soap and water Follow up if symptoms worsen    ED Prescriptions    Medication Sig Dispense Auth. Provider   doxycycline (VIBRAMYCIN) 100 MG capsule Take 1 capsule (100 mg total) by mouth 2 (two) times daily. 10 capsule Hans Eden, NP     PDMP not reviewed this encounter.   Hans Eden, NP 01/09/21 1434

## 2021-01-01 NOTE — ED Triage Notes (Signed)
Pt in following up from visit on 1/23 states she had an x-ray Done and it showed that she had glass stuck in her finger.  Pt states the glass is still in her finger and its has come to the surface   Pt was taking amoxicillin and has finished

## 2021-01-01 NOTE — Discharge Instructions (Signed)
Take antibiotics twice daily Can cleanse area with diluted soap and water Follow up if symptoms worsen

## 2021-01-09 ENCOUNTER — Encounter (HOSPITAL_COMMUNITY)
Admission: RE | Disposition: A | Discharge status: Home / Self Care | Payer: Self-pay | Source: Home / Self Care | Attending: Orthopedic Surgery

## 2021-01-09 ENCOUNTER — Ambulatory Visit (HOSPITAL_COMMUNITY)
Admission: RE | Admit: 2021-01-09 | Discharge: 2021-01-09 | Disposition: A | Discharge status: Home / Self Care | Payer: BC Managed Care – PPO | Attending: Orthopedic Surgery | Admitting: Orthopedic Surgery

## 2021-01-09 ENCOUNTER — Encounter (HOSPITAL_COMMUNITY): Payer: Self-pay | Admitting: Orthopedic Surgery

## 2021-01-09 ENCOUNTER — Other Ambulatory Visit: Payer: Self-pay | Admitting: Orthopedic Surgery

## 2021-01-09 ENCOUNTER — Ambulatory Visit (HOSPITAL_COMMUNITY): Payer: BC Managed Care – PPO | Admitting: Anesthesiology

## 2021-01-09 DIAGNOSIS — Z882 Allergy status to sulfonamides status: Secondary | ICD-10-CM | POA: Diagnosis not present

## 2021-01-09 DIAGNOSIS — M795 Residual foreign body in soft tissue: Secondary | ICD-10-CM | POA: Insufficient documentation

## 2021-01-09 DIAGNOSIS — Z90710 Acquired absence of both cervix and uterus: Secondary | ICD-10-CM | POA: Insufficient documentation

## 2021-01-09 DIAGNOSIS — S60454A Superficial foreign body of right ring finger, initial encounter: Secondary | ICD-10-CM

## 2021-01-09 DIAGNOSIS — Z20822 Contact with and (suspected) exposure to covid-19: Secondary | ICD-10-CM | POA: Insufficient documentation

## 2021-01-09 HISTORY — PX: FOREIGN BODY REMOVAL: SHX962

## 2021-01-09 LAB — SARS CORONAVIRUS 2 BY RT PCR (HOSPITAL ORDER, PERFORMED IN ~~LOC~~ HOSPITAL LAB): SARS Coronavirus 2: NEGATIVE

## 2021-01-09 SURGERY — REMOVAL FOREIGN BODY EXTREMITY
Anesthesia: Monitor Anesthesia Care | Site: Ring Finger | Laterality: Right

## 2021-01-09 MED ORDER — FENTANYL CITRATE (PF) 250 MCG/5ML IJ SOLN
INTRAMUSCULAR | Status: DC | PRN
  Administered 2021-01-09: 50 ug via INTRAVENOUS

## 2021-01-09 MED ORDER — TRAMADOL HCL 50 MG PO TABS: ORAL_TABLET | ORAL | 0 refills | Status: DC

## 2021-01-09 MED ORDER — ONDANSETRON HCL 4 MG/2ML IJ SOLN: 4.0000 mg | Freq: Once | INTRAMUSCULAR | Status: DC | PRN

## 2021-01-09 MED ORDER — ONDANSETRON HCL 4 MG/2ML IJ SOLN
INTRAMUSCULAR | Status: DC | PRN
  Administered 2021-01-09: 4 mg via INTRAVENOUS

## 2021-01-09 MED ORDER — 0.9 % SODIUM CHLORIDE (POUR BTL) OPTIME
TOPICAL | Status: DC | PRN
  Administered 2021-01-09: 1000 mL

## 2021-01-09 MED ORDER — CEFAZOLIN SODIUM-DEXTROSE 2-4 GM/100ML-% IV SOLN
INTRAVENOUS | Status: AC
  Filled 2021-01-09: qty 100

## 2021-01-09 MED ORDER — CEFAZOLIN SODIUM-DEXTROSE 2-4 GM/100ML-% IV SOLN
2.0000 g | INTRAVENOUS | Status: AC
  Administered 2021-01-09: 2 g via INTRAVENOUS

## 2021-01-09 MED ORDER — PROPOFOL 10 MG/ML IV BOLUS
INTRAVENOUS | Status: AC
  Filled 2021-01-09: qty 20

## 2021-01-09 MED ORDER — MEPERIDINE HCL 25 MG/ML IJ SOLN: 6.2500 mg | INTRAMUSCULAR | Status: DC | PRN

## 2021-01-09 MED ORDER — ACETAMINOPHEN 325 MG PO TABS: 325.0000 mg | ORAL_TABLET | ORAL | Status: DC | PRN

## 2021-01-09 MED ORDER — MIDAZOLAM HCL 2 MG/2ML IJ SOLN
INTRAMUSCULAR | Status: AC
  Filled 2021-01-09: qty 2

## 2021-01-09 MED ORDER — ACETAMINOPHEN 160 MG/5ML PO SOLN: 325.0000 mg | ORAL | Status: DC | PRN

## 2021-01-09 MED ORDER — BUPIVACAINE HCL (PF) 0.25 % IJ SOLN
INTRAMUSCULAR | Status: AC
  Filled 2021-01-09: qty 30

## 2021-01-09 MED ORDER — ONDANSETRON HCL 4 MG/2ML IJ SOLN
INTRAMUSCULAR | Status: AC
  Filled 2021-01-09: qty 2

## 2021-01-09 MED ORDER — LACTATED RINGERS IV SOLN: INTRAVENOUS | Status: DC

## 2021-01-09 MED ORDER — MIDAZOLAM HCL 5 MG/5ML IJ SOLN
INTRAMUSCULAR | Status: DC | PRN
  Administered 2021-01-09: 2 mg via INTRAVENOUS

## 2021-01-09 MED ORDER — FENTANYL CITRATE (PF) 250 MCG/5ML IJ SOLN
INTRAMUSCULAR | Status: AC
  Filled 2021-01-09: qty 5

## 2021-01-09 MED ORDER — OXYCODONE HCL 5 MG/5ML PO SOLN: 5.0000 mg | Freq: Once | ORAL | Status: DC | PRN

## 2021-01-09 MED ORDER — FENTANYL CITRATE (PF) 100 MCG/2ML IJ SOLN
25.0000 ug | INTRAMUSCULAR | Status: DC | PRN
Start: 2021-01-09 — End: 2021-01-10

## 2021-01-09 MED ORDER — LIDOCAINE 2% (20 MG/ML) 5 ML SYRINGE
INTRAMUSCULAR | Status: DC | PRN
  Administered 2021-01-09: 50 mg via INTRAVENOUS

## 2021-01-09 MED ORDER — BUPIVACAINE HCL (PF) 0.25 % IJ SOLN
INTRAMUSCULAR | Status: DC | PRN
  Administered 2021-01-09: 10 mL

## 2021-01-09 MED ORDER — LIDOCAINE 2% (20 MG/ML) 5 ML SYRINGE
INTRAMUSCULAR | Status: AC
  Filled 2021-01-09: qty 5

## 2021-01-09 MED ORDER — OXYCODONE HCL 5 MG PO TABS: 5.0000 mg | ORAL_TABLET | Freq: Once | ORAL | Status: DC | PRN

## 2021-01-09 MED ORDER — PROPOFOL 500 MG/50ML IV EMUL
INTRAVENOUS | Status: DC | PRN
  Administered 2021-01-09: 100 ug/kg/min via INTRAVENOUS

## 2021-01-09 SURGICAL SUPPLY — 59 items
ADAPTER CATH SYR TO TUBING 38M (ADAPTER) ×1 IMPLANT
ADPR CATH LL SYR 3/32 TPR (ADAPTER)
BNDG CMPR 9X4 STRL LF SNTH (GAUZE/BANDAGES/DRESSINGS)
BNDG COHESIVE 1X5 TAN STRL LF (GAUZE/BANDAGES/DRESSINGS) ×1 IMPLANT
BNDG COHESIVE 2X5 TAN STRL LF (GAUZE/BANDAGES/DRESSINGS) IMPLANT
BNDG ELASTIC 3X5.8 VLCR STR LF (GAUZE/BANDAGES/DRESSINGS) ×1 IMPLANT
BNDG ELASTIC 4X5.8 VLCR STR LF (GAUZE/BANDAGES/DRESSINGS) ×1 IMPLANT
BNDG ESMARK 4X9 LF (GAUZE/BANDAGES/DRESSINGS) IMPLANT
BNDG GAUZE ELAST 4 BULKY (GAUZE/BANDAGES/DRESSINGS) ×1 IMPLANT
CANNULA VESSEL 3MM 2 BLNT TIP (CANNULA) IMPLANT
CORD BIPOLAR FORCEPS 12FT (ELECTRODE) ×2 IMPLANT
COVER SURGICAL LIGHT HANDLE (MISCELLANEOUS) ×2 IMPLANT
COVER WAND RF STERILE (DRAPES) ×1 IMPLANT
CUFF TOURN SGL QUICK 18X4 (TOURNIQUET CUFF) IMPLANT
CUFF TOURN SGL QUICK 24 (TOURNIQUET CUFF) ×2
CUFF TRNQT CYL 24X4X16.5-23 (TOURNIQUET CUFF) IMPLANT
DECANTER SPIKE VIAL GLASS SM (MISCELLANEOUS) ×1 IMPLANT
DRAIN PENROSE 1/4X12 LTX STRL (WOUND CARE) IMPLANT
DRSG PAD ABDOMINAL 8X10 ST (GAUZE/BANDAGES/DRESSINGS) ×2 IMPLANT
FACESHIELD WRAPAROUND (MASK) ×2 IMPLANT
FACESHIELD WRAPAROUND OR TEAM (MASK) IMPLANT
GAUZE SPONGE 4X4 12PLY STRL (GAUZE/BANDAGES/DRESSINGS) ×1 IMPLANT
GAUZE XEROFORM 1X8 LF (GAUZE/BANDAGES/DRESSINGS) ×2 IMPLANT
GLOVE BIO SURGEON STRL SZ7.5 (GLOVE) ×2 IMPLANT
GLOVE BIOGEL PI IND STRL 8.5 (GLOVE) IMPLANT
GLOVE BIOGEL PI INDICATOR 8.5 (GLOVE)
GLOVE BIOGEL PI ORTHO PRO SZ8 (GLOVE)
GLOVE PI ORTHO PRO STRL SZ8 (GLOVE) IMPLANT
GLOVE SRG 8 PF TXTR STRL LF DI (GLOVE) ×1 IMPLANT
GLOVE SURG ORTHO 8.0 STRL STRW (GLOVE) IMPLANT
GLOVE SURG UNDER POLY LF SZ8 (GLOVE) ×2
GOWN STRL REUS W/ TWL LRG LVL3 (GOWN DISPOSABLE) ×1 IMPLANT
GOWN STRL REUS W/ TWL XL LVL3 (GOWN DISPOSABLE) ×1 IMPLANT
GOWN STRL REUS W/TWL LRG LVL3 (GOWN DISPOSABLE) ×2
GOWN STRL REUS W/TWL XL LVL3 (GOWN DISPOSABLE) ×2
KIT BASIN OR (CUSTOM PROCEDURE TRAY) ×2 IMPLANT
KIT TURNOVER KIT B (KITS) ×2 IMPLANT
LOOP VESSEL MAXI BLUE (MISCELLANEOUS) IMPLANT
MANIFOLD NEPTUNE II (INSTRUMENTS) IMPLANT
NDL HYPO 25X1 1.5 SAFETY (NEEDLE) IMPLANT
NEEDLE HYPO 25X1 1.5 SAFETY (NEEDLE) IMPLANT
NS IRRIG 1000ML POUR BTL (IV SOLUTION) ×2 IMPLANT
PACK ORTHO EXTREMITY (CUSTOM PROCEDURE TRAY) ×2 IMPLANT
PAD ARMBOARD 7.5X6 YLW CONV (MISCELLANEOUS) ×4 IMPLANT
SET CYSTO W/LG BORE CLAMP LF (SET/KITS/TRAYS/PACK) IMPLANT
SOL PREP POV-IOD 4OZ 10% (MISCELLANEOUS) ×4 IMPLANT
SPONGE LAP 4X18 RFD (DISPOSABLE) ×2 IMPLANT
SUT ETHILON 4 0 P 3 18 (SUTURE) IMPLANT
SUT ETHILON 4 0 PS 2 18 (SUTURE) IMPLANT
SUT MON AB 5-0 P3 18 (SUTURE) IMPLANT
SWAB COLLECTION DEVICE MRSA (MISCELLANEOUS) ×1 IMPLANT
SWAB CULTURE ESWAB REG 1ML (MISCELLANEOUS) ×1 IMPLANT
SYR 20ML LL LF (SYRINGE) ×2 IMPLANT
SYR CONTROL 10ML LL (SYRINGE) IMPLANT
TOWEL GREEN STERILE (TOWEL DISPOSABLE) ×2 IMPLANT
TUBE CONNECTING 12X1/4 (SUCTIONS) ×2 IMPLANT
TUBE FEEDING ENTERAL 5FR 16IN (TUBING) IMPLANT
UNDERPAD 30X36 HEAVY ABSORB (UNDERPADS AND DIAPERS) ×2 IMPLANT
YANKAUER SUCT BULB TIP NO VENT (SUCTIONS) ×2 IMPLANT

## 2021-01-09 NOTE — Transfer of Care (Signed)
Immediate Anesthesia Transfer of Care Note  Patient: ZARAI ORSBORN  Procedure(s) Performed: REMOVAL FOREIGN BODY RIGHT RING FINGER (Right Ring Finger)  Patient Location: PACU  Anesthesia Type:MAC  Level of Consciousness: awake and alert   Airway & Oxygen Therapy: Patient Spontanous Breathing and Patient connected to face mask oxygen  Post-op Assessment: Report given to RN and Post -op Vital signs reviewed and stable  Post vital signs: Reviewed and stable  Last Vitals:  Vitals Value Taken Time  BP    Temp    Pulse 65 01/09/21 1921  Resp 17 01/09/21 1921  SpO2 99 % 01/09/21 1921  Vitals shown include unvalidated device data.  Last Pain:  Vitals:   01/09/21 1331  TempSrc:   PainSc: 0-No pain         Complications: No complications documented.

## 2021-01-09 NOTE — H&P (Signed)
Renee Webb is an 55 y.o. female.   Chief Complaint: foreign body HPI: 55 yo female states she got a piece of glass in right ring finger 12/20/20.  Has had portions of it removed, but it feels like some remains.  She wishes to proceed with removal foreign body in the OR.  Allergies:  Allergies  Allergen Reactions  . Other Other (See Comments)    "dyes in Triaminic"=face swelling  . Red Dye Other (See Comments)  . Sulfa Antibiotics     Past Medical History:  Diagnosis Date  . Allergy   . Anemia    resolved- most recent CBC 06/2018 showed no anemia    Past Surgical History:  Procedure Laterality Date  . ABDOMINAL HYSTERECTOMY    . COLONOSCOPY  02/04/2016  . MYOMECTOMY    . SEPTOPLASTY    . UTERINE FIBROID SURGERY      Family History: Family History  Problem Relation Age of Onset  . Colon cancer Mother 68  . Hypertension Father     Social History:   reports that she has never smoked. She has never used smokeless tobacco. She reports that she does not drink alcohol and does not use drugs.  Medications: Medications Prior to Admission  Medication Sig Dispense Refill  . amoxicillin-clavulanate (AUGMENTIN) 875-125 MG tablet Take 1 tablet by mouth every 12 (twelve) hours. 14 tablet 0  . Ascorbic Acid (VITAMIN C) 500 MG CHEW Chew 1 tablet by mouth daily.    . B Complex Vitamins (VITAMIN B COMPLEX) TABS Take 1 tablet by mouth daily.    Marland Kitchen doxycycline (VIBRAMYCIN) 100 MG capsule Take 1 capsule (100 mg total) by mouth 2 (two) times daily. 10 capsule 0  . ELDERBERRY PO Take by mouth.    . fexofenadine (ALLEGRA) 180 MG tablet Take 180 mg by mouth daily.    . montelukast (SINGULAIR) 10 MG tablet Take 10 mg by mouth at bedtime.    . Aspirin-Caffeine (BAYER BACK & BODY PO) Take by mouth.      Results for orders placed or performed during the hospital encounter of 01/09/21 (from the past 48 hour(s))  SARS Coronavirus 2 by RT PCR (hospital order, performed in [facility]  hospital lab) Nasopharyngeal Nasopharyngeal Swab     Status: None   Collection Time: 01/09/21 12:24 PM   Specimen: Nasopharyngeal Swab  Result Value Ref Range   SARS Coronavirus 2 NEGATIVE NEGATIVE    Comment: (NOTE) SARS-CoV-2 target nucleic acids are NOT DETECTED.  The SARS-CoV-2 RNA is generally detectable in upper and lower respiratory specimens during the acute phase of infection. The lowest concentration of SARS-CoV-2 viral copies this assay can detect is 250 copies / mL. A negative result does not preclude SARS-CoV-2 infection and should not be used as the sole basis for treatment or other patient management decisions.  A negative result may occur with improper specimen collection / handling, submission of specimen other than nasopharyngeal swab, presence of viral mutation(s) within the areas targeted by this assay, and inadequate number of viral copies (<250 copies / mL). A negative result must be combined with clinical observations, patient history, and epidemiological information.  Fact Sheet for Patients:   StrictlyIdeas.no  Fact Sheet for Healthcare Providers: BankingDealers.co.za  This test is not yet approved or  cleared by the [place] FDA and has been authorized for detection and/or diagnosis of SARS-CoV-2 by FDA under an Emergency Use Authorization (EUA).  This EUA will remain in effect (meaning this test can  be used) for the duration of the COVID-19 declaration under Section 564(b)(1) of the Act, 21 U.S.C. section 360bbb-3(b)(1), unless the authorization is terminated or revoked sooner.  Performed at [facility], [place] [address], [place], [place] [address]     No results found.   A comprehensive review of systems was negative.  Blood pressure 131/87, pulse 70, temperature (!) 97.5 F (36.4 C), temperature source Temporal, resp. rate 18, height 5\' 10"  (1.778 m), weight 95.3 kg, SpO2 98 %.  General  appearance: alert, cooperative and appears stated age Head: Normocephalic, without obvious abnormality, atraumatic Neck: supple, symmetrical, trachea midline Cardio: regular rate and rhythm Resp: clear to auscultation bilaterally Extremities: Intact sensation and capillary refill all digits.  +epl/fpl/io.  Small wound pad right ring finger  Pulses: 2+ and symmetric Skin: Skin color, texture, turgor normal. No rashes or lesions Neurologic: Grossly normal Incision/Wound: as above  Assessment/Plan Right ring finger retained foreign body.  Plan removal foreign body in OR.  Non operative and operative treatment options have been discussed with the patient and patient wishes to proceed with operative treatment. Risks, benefits, and alternatives of surgery have been discussed and the patient agrees with the plan of care.   Leanora Cover 01/09/2021, 6:21 PM

## 2021-01-09 NOTE — Anesthesia Postprocedure Evaluation (Signed)
Anesthesia Post Note  Patient: Renee Webb  Procedure(s) Performed: REMOVAL FOREIGN BODY RIGHT RING FINGER (Right Ring Finger)     Patient location during evaluation: PACU Anesthesia Type: MAC Level of consciousness: awake and alert Pain management: pain level controlled Vital Signs Assessment: post-procedure vital signs reviewed and stable Respiratory status: spontaneous breathing, nonlabored ventilation, respiratory function stable and patient connected to nasal cannula oxygen Cardiovascular status: stable and blood pressure returned to baseline Postop Assessment: no apparent nausea or vomiting Anesthetic complications: no   No complications documented.  Last Vitals:  Vitals:   01/09/21 1920 01/09/21 1935  BP: 117/74 139/89  Pulse: 68 60  Resp: 18 14  Temp: 36.8 C   SpO2: 100% 100%    Last Pain:  Vitals:   01/09/21 1935  TempSrc:   PainSc: 0-No pain                 Tiajuana Amass

## 2021-01-09 NOTE — Op Note (Addendum)
NAME: [place] RECORD NO: [id number] DATE OF BIRTH: August 28, 1966 FACILITY: [facility] LOCATION: [facility] OR PHYSICIAN: Tennis Must, MD   OPERATIVE REPORT   DATE OF PROCEDURE: 01/09/21    PREOPERATIVE DIAGNOSIS:   Foreign body right ring finger   POSTOPERATIVE DIAGNOSIS:   Foreign body right ring finger   PROCEDURE:   Removal deep foreign body right ring finger   SURGEON:  Leanora Cover, M.D.   ASSISTANT: none   ANESTHESIA:  Local with sedation   INTRAVENOUS FLUIDS:  Per anesthesia flow sheet.   ESTIMATED BLOOD LOSS:  Minimal.   COMPLICATIONS:  None.   SPECIMENS:   Cultures to micro   TOURNIQUET TIME:    Total Tourniquet Time Documented: Upper Arm (Right) - 14 minutes Total: Upper Arm (Right) - 14 minutes    DISPOSITION:  Stable to PACU.   INDICATIONS: 55 year old female states she got a piece of glass in her right ring finger.  Portions of glass of been able to be removed but she still feels something remains.  It is bothersome to her.  She was to have it removed. Risks, benefits and alternatives of surgery were discussed including the risks of blood loss, infection, damage to nerves, vessels, tendons, ligaments, bone for surgery, need for additional surgery, complications with wound healing, continued pain, stiffness, retained foreign body.  She voiced understanding of these risks and elected to proceed.  OPERATIVE COURSE:  After being identified preoperatively by myself,  the patient and I agreed on the procedure and site of the procedure.  The surgical site was marked.  Surgical consent had been signed. She was given IV antibiotics as preoperative antibiotic prophylaxis. She was transferred to the operating room and placed on the operating table in supine position with the Right upper extremity on an arm board.  Sedation was induced by the anesthesiologist.  Right upper extremity was prepped and draped in normal sterile orthopedic fashion.  A surgical pause was  performed between the surgeons, anesthesia, and operating room staff and all were in agreement as to the patient, procedure, and site of procedure.  Digital block was performed with 10 cc of quarter percent plain Marcaine.  Tourniquet at the proximal aspect of the extremity was inflated to 250 mmHg after exsanguination of the arm with an Esmarch bandage.    A small incision was made adjacent to the wound in the pad of the finger.  The foreign body was visible.  It was not able to be retrieved without concern of breaking it.  The small skin wound was ellipsed.  This the skin to be removed and the foreign body grasped and removed.  It appeared to be a slender piece of glass.  The area was probed.  No remaining foreign body was noted.  There was some discoloration deep in the wound.  Cultures were taken for aerobes and anaerobes.  The wound went down to the subcutaneous tissues.  No purulence or sign of infection was noted.  The wound was copiously irrigated with sterile saline.  Was then closed with 5-0 Monocryl suture in a horizontal mattress fashion.  Wound was dressed with sterile Xeroform and 4 x 4 and wrapped with a Coban dressing lightly.  The tourniquet was deflated at 14 minutes.  Fingertips were pink with brisk capillary refill after deflation of tourniquet.  The operative  drapes were broken down.  The patient was awoken from anesthesia safely.  She was transferred back to the stretcher and taken to PACU  in stable condition.  I will see her back in the office in 1 week for postoperative followup.  I will give her a prescription for ultram 50 mg 1-2 tabs po q6 hours prn pain, dispense #15.   Leanora Cover, MD Electronically signed, 01/09/21

## 2021-01-09 NOTE — Anesthesia Procedure Notes (Signed)
Procedure Name: MAC Date/Time: 01/09/2021 6:31 PM Performed by: Alain Marion, CRNA Pre-anesthesia Checklist: Patient identified, Emergency Drugs available, Suction available and Patient being monitored Oxygen Delivery Method: Simple face mask Placement Confirmation: positive ETCO2

## 2021-01-09 NOTE — Discharge Instructions (Addendum)

## 2021-01-09 NOTE — Anesthesia Preprocedure Evaluation (Addendum)
Anesthesia Evaluation  Patient identified by MRN, date of birth, ID band Patient awake    Reviewed: Allergy & Precautions, H&P , NPO status , Patient's Chart, lab work & pertinent test results, reviewed documented beta blocker date and time   Airway Mallampati: II  TM Distance: >3 FB Neck ROM: full    Dental no notable dental hx. (+) Teeth Intact, Dental Advisory Given   Pulmonary neg pulmonary ROS,    Pulmonary exam normal breath sounds clear to auscultation       Cardiovascular Exercise Tolerance: Good negative cardio ROS   Rhythm:regular Rate:Normal     Neuro/Psych negative neurological ROS  negative psych ROS   GI/Hepatic negative GI ROS, Neg liver ROS,   Endo/Other  Morbid obesity  Renal/GU negative Renal ROS  negative genitourinary   Musculoskeletal   Abdominal (+) + obese,   Peds  Hematology negative hematology ROS (+)   Anesthesia Other Findings   Reproductive/Obstetrics negative OB ROS                            Anesthesia Physical Anesthesia Plan  ASA: II  Anesthesia Plan: MAC   Post-op Pain Management:    Induction: Intravenous  PONV Risk Score and Plan: 3 and Ondansetron, Dexamethasone and Treatment may vary due to age or medical condition  Airway Management Planned: Nasal Cannula, Natural Airway, Simple Face Mask and Mask  Additional Equipment: None  Intra-op Plan:   Post-operative Plan:   Informed Consent: I have reviewed the patients History and Physical, chart, labs and discussed the procedure including the risks, benefits and alternatives for the proposed anesthesia with the patient or authorized representative who has indicated his/her understanding and acceptance.     Dental Advisory Given  Plan Discussed with: CRNA and Anesthesiologist  Anesthesia Plan Comments: ( )       Anesthesia Quick Evaluation

## 2021-01-10 ENCOUNTER — Encounter (HOSPITAL_COMMUNITY): Payer: Self-pay | Admitting: Orthopedic Surgery

## 2021-01-14 LAB — AEROBIC/ANAEROBIC CULTURE W GRAM STAIN (SURGICAL/DEEP WOUND)
Culture: NO GROWTH
Gram Stain: NONE SEEN

## 2021-01-26 ENCOUNTER — Encounter: Payer: Self-pay | Admitting: Gastroenterology

## 2021-03-26 ENCOUNTER — Ambulatory Visit (AMBULATORY_SURGERY_CENTER): Payer: Self-pay | Admitting: *Deleted

## 2021-03-26 ENCOUNTER — Other Ambulatory Visit: Payer: Self-pay

## 2021-03-26 VITALS — Ht 69.25 in | Wt 215.0 lb

## 2021-03-26 DIAGNOSIS — Z800 Family history of malignant neoplasm of digestive organs: Secondary | ICD-10-CM

## 2021-03-26 NOTE — Progress Notes (Signed)

## 2021-04-09 ENCOUNTER — Ambulatory Visit (AMBULATORY_SURGERY_CENTER): Payer: BC Managed Care – PPO | Admitting: Gastroenterology

## 2021-04-09 ENCOUNTER — Other Ambulatory Visit: Payer: Self-pay

## 2021-04-09 ENCOUNTER — Encounter: Payer: Self-pay | Admitting: Gastroenterology

## 2021-04-09 VITALS — BP 110/70 | HR 73 | Temp 97.3°F | Resp 19 | Ht 69.25 in | Wt 215.0 lb

## 2021-04-09 DIAGNOSIS — D122 Benign neoplasm of ascending colon: Secondary | ICD-10-CM | POA: Diagnosis not present

## 2021-04-09 DIAGNOSIS — Z800 Family history of malignant neoplasm of digestive organs: Secondary | ICD-10-CM | POA: Diagnosis present

## 2021-04-09 DIAGNOSIS — Z1211 Encounter for screening for malignant neoplasm of colon: Secondary | ICD-10-CM | POA: Diagnosis not present

## 2021-04-09 MED ORDER — SODIUM CHLORIDE 0.9 % IV SOLN: 500.0000 mL | Freq: Once | INTRAVENOUS | Status: DC

## 2021-04-09 NOTE — Progress Notes (Signed)
VS by Malott  Pt's states no medical or surgical changes since previsit or office visit.  

## 2021-04-09 NOTE — Op Note (Signed)
[name] Procedure Date: 04/09/2021 7:22 AM MRN: [id number] Endoscopist: Mauri Pole , MD Age: 55 Referring MD:  Date of Birth: [birth year] Gender: Female Account #: [id number] Procedure:                Colonoscopy Indications:              Screening in patient at increased risk: Family                            history of 1st-degree relative with colorectal                            cancer Medicines:                Monitored Anesthesia Care Procedure:                Pre-Anesthesia Assessment:                           - Prior to the procedure, a History and Physical                            was performed, and patient medications and                            allergies were reviewed. The patient's tolerance of                            previous anesthesia was also reviewed. The risks                            and benefits of the procedure and the sedation                            options and risks were discussed with the patient.                            All questions were answered, and informed consent                            was obtained. Prior Anticoagulants: The patient has                            taken no previous anticoagulant or antiplatelet                            agents. ASA Grade Assessment: II - A patient with                            mild systemic disease. After reviewing the risks                            and benefits, the patient was deemed in  satisfactory condition to undergo the procedure.                           After obtaining informed consent, the colonoscope                            was passed under direct vision. Throughout the                            procedure, the patient's blood pressure, pulse, and                            oxygen saturations were monitored continuously. The                            Olympus PCF-H190DL ([id number]) Colonoscope was                             introduced through the anus and advanced to the the                            cecum, identified by appendiceal orifice and                            ileocecal valve. The colonoscopy was performed                            without difficulty. The patient tolerated the                            procedure well. The quality of the bowel                            preparation was adequate. The ileocecal valve,                            appendiceal orifice, and rectum were photographed. Scope In: 7:57:37 AM Scope Out: 8:14:31 AM Scope Withdrawal Time: 0 hours 11 minutes 27 seconds  Total Procedure Duration: 0 hours 16 minutes 54 seconds  Findings:                 The perianal and digital rectal examinations were                            normal.                           A 5 mm polyp was found in the ascending colon. The                            polyp was sessile. The polyp was removed with a                            cold snare. Resection and retrieval were complete.  A less than 1 mm polyp was found in the ascending                            colon. The polyp was sessile. The polyp was removed                            with a cold biopsy forceps. Resection and retrieval                            were complete.                           Non-bleeding external and internal hemorrhoids were                            found during retroflexion. The hemorrhoids were                            medium-sized. Complications:            No immediate complications. Estimated Blood Loss:     Estimated blood loss was minimal. Impression:               - One 5 mm polyp in the ascending colon, removed                            with a cold snare. Resected and retrieved.                           - One less than 1 mm polyp in the ascending colon,                            removed with a cold biopsy forceps. Resected and                             retrieved.                           - Non-bleeding external and internal hemorrhoids. Recommendation:           - Patient has a contact number available for                            emergencies. The signs and symptoms of potential                            delayed complications were discussed with the                            patient. Return to normal activities tomorrow.                            Written discharge instructions were provided to the  patient.                           - Resume previous diet.                           - Continue present medications.                           - Await pathology results.                           - Repeat colonoscopy in 5 years for surveillance                            based on pathology results. Mauri Pole, MD 04/09/2021 8:34:29 AM This report has been signed electronically.

## 2021-04-09 NOTE — Progress Notes (Signed)
PT taken to PACU. Monitors in place. VSS. Report given to RN. 

## 2021-04-09 NOTE — Progress Notes (Signed)
Called to room to assist during endoscopic procedure.  Patient ID and intended procedure confirmed with present staff. Received instructions for my participation in the procedure from the performing physician.  

## 2021-04-09 NOTE — Patient Instructions (Signed)
Handouts given:  Polyps Resume previous diet Continue current medications Await pathology results YOU HAD AN ENDOSCOPIC PROCEDURE TODAY AT THE [facility]:   Refer to the procedure report that was given to you for any specific questions about what was found during the examination.  If the procedure report does not answer your questions, please call your gastroenterologist to clarify.  If you requested that your care partner not be given the details of your procedure findings, then the procedure report has been included in a sealed envelope for you to review at your convenience later.  YOU SHOULD EXPECT: Some feelings of bloating in the abdomen. Passage of more gas than usual.  Walking can help get rid of the air that was put into your GI tract during the procedure and reduce the bloating. If you had a lower endoscopy (such as a colonoscopy or flexible sigmoidoscopy) you may notice spotting of blood in your stool or on the toilet paper. If you underwent a bowel prep for your procedure, you may not have a normal bowel movement for a few days.  Please Note:  You might notice some irritation and congestion in your nose or some drainage.  This is from the oxygen used during your procedure.  There is no need for concern and it should clear up in a day or so.  SYMPTOMS TO REPORT IMMEDIATELY:   Following lower endoscopy (colonoscopy or flexible sigmoidoscopy):  Excessive amounts of blood in the stool  Significant tenderness or worsening of abdominal pains  Swelling of the abdomen that is new, acute  Fever of 100F or higher  For urgent or emergent issues, a gastroenterologist can be reached at any hour by calling [phone]. Do not use MyChart messaging for urgent concerns.   DIET:  We do recommend a small meal at first, but then you may proceed to your regular diet.  Drink plenty of fluids but you should avoid alcoholic beverages for 24 hours.  ACTIVITY:  You should plan to take it  easy for the rest of today and you should NOT DRIVE or use heavy machinery until tomorrow (because of the sedation medicines used during the test).    FOLLOW UP: Our staff will call the number listed on your records 48-72 hours following your procedure to check on you and address any questions or concerns that you may have regarding the information given to you following your procedure. If we do not reach you, we will leave a message.  We will attempt to reach you two times.  During this call, we will ask if you have developed any symptoms of COVID 19. If you develop any symptoms (ie: fever, flu-like symptoms, shortness of breath, cough etc.) before then, please call [phone].  If you test positive for Covid 19 in the 2 weeks post procedure, please call and report this information to us.    If any biopsies were taken you will be contacted by phone or by letter within the next 1-3 weeks.  Please call us at [phone] if you have not heard about the biopsies in 3 weeks.   SIGNATURES/CONFIDENTIALITY: You and/or your care partner have signed paperwork which will be entered into your electronic medical record.  These signatures attest to the fact that that the information above on your After Visit Summary has been reviewed and is understood.  Full responsibility of the confidentiality of this discharge information lies with you and/or your care-partner. 

## 2021-04-13 ENCOUNTER — Telehealth: Payer: Self-pay | Admitting: Gastroenterology

## 2021-04-13 ENCOUNTER — Telehealth: Payer: Self-pay

## 2021-04-13 NOTE — Telephone Encounter (Signed)
Thank you :)

## 2021-04-13 NOTE — Telephone Encounter (Signed)
Made pt aware of Dr. Woodward Ku response. Pt agreeable to plan of care.

## 2021-04-13 NOTE — Telephone Encounter (Signed)
Inbound call from patient stating she has been felling light headed since she had her p[rocedure on 04/09/21 and wants to know if that is normal.  Please advise.

## 2021-04-13 NOTE — Telephone Encounter (Signed)
Called pt back. States since her procedure (starting more on Saturday) she has been feeling light-headed. C/o dizziness and light-headed feeling similar to how she felt when she got up to get her clothes on after the procedure last week. Pt also reported that the light-headedness occurred more when she turned her head/neck to the left. Also reported that when she turned her head/neck to the left she had some lip numbness as well. Reports that she is not having as much/frequent episodes of light-headedness today as she did over the weekend and states she has not had the lip numbness today. RN asked pt if she has been drinking plenty of fluids or more than normal since her procedure to rehydrate. Pt states "no, I have really just been drinking with meal." RN encouraged to increase her water intake and to drink something like Gatorade with electrolytes. Instructed pt that she may still be dehydrated from the bowel prep. RN stated that Dr. Silverio Decamp would be made aware of her symptoms and if there are any further concerns or suggestions that a staff member will call her back. Pt agreeable to plan of care.

## 2021-04-13 NOTE — Telephone Encounter (Signed)
  Follow up Call-  Call back number 04/09/2021  Post procedure Call Back phone  # [phone]  Permission to leave phone message Yes  Some recent data might be hidden     Patient questions:  Do you have a fever, pain , or abdominal swelling? No. Pain Score  0 *  Have you tolerated food without any problems? Yes.    Have you been able to return to your normal activities? Yes.    Do you have any questions about your discharge instructions: Diet   No. Medications  No. Follow up visit  No.  Do you have questions or concerns about your Care? No.  Actions: * If pain score is 4 or above: No action needed, pain <4.  1. Have you developed a fever since your procedure? no  2.   Have you had an respiratory symptoms (SOB or cough) since your procedure? no  3.   Have you tested positive for COVID 19 since your procedure no  4.   Have you had any family members/close contacts diagnosed with the COVID 19 since your procedure?  no   If yes to any of these questions please route to Joylene John, RN and Joella Prince, RN

## 2021-04-13 NOTE — Telephone Encounter (Signed)
Ok, agree with increasing fluid intake but will need to contact PMD if has persistent dizziness to further evaluate vertigo. Thanks

## 2021-04-17 ENCOUNTER — Encounter: Payer: Self-pay | Admitting: Gastroenterology

## 2022-01-18 ENCOUNTER — Other Ambulatory Visit: Payer: Self-pay | Admitting: Obstetrics & Gynecology

## 2022-01-18 DIAGNOSIS — R928 Other abnormal and inconclusive findings on diagnostic imaging of breast: Secondary | ICD-10-CM

## 2022-01-26 ENCOUNTER — Ambulatory Visit: Payer: BC Managed Care – PPO

## 2022-01-26 ENCOUNTER — Other Ambulatory Visit: Payer: Self-pay | Admitting: Obstetrics & Gynecology

## 2022-01-26 ENCOUNTER — Ambulatory Visit
Admission: RE | Admit: 2022-01-26 | Discharge: 2022-01-26 | Disposition: A | Discharge status: Home / Self Care | Payer: BC Managed Care – PPO | Source: Ambulatory Visit | Attending: Obstetrics & Gynecology | Admitting: Obstetrics & Gynecology

## 2022-01-26 DIAGNOSIS — R928 Other abnormal and inconclusive findings on diagnostic imaging of breast: Secondary | ICD-10-CM

## 2022-01-26 DIAGNOSIS — N6489 Other specified disorders of breast: Secondary | ICD-10-CM

## 2022-07-27 ENCOUNTER — Ambulatory Visit
Admission: RE | Admit: 2022-07-27 | Discharge: 2022-07-27 | Disposition: A | Discharge status: Home / Self Care | Payer: BC Managed Care – PPO | Source: Ambulatory Visit | Attending: Obstetrics & Gynecology | Admitting: Obstetrics & Gynecology

## 2022-07-27 ENCOUNTER — Other Ambulatory Visit: Payer: Self-pay | Admitting: Obstetrics & Gynecology

## 2022-07-27 DIAGNOSIS — N6489 Other specified disorders of breast: Secondary | ICD-10-CM

## 2022-08-25 ENCOUNTER — Other Ambulatory Visit: Payer: Self-pay

## 2022-08-25 ENCOUNTER — Encounter (HOSPITAL_COMMUNITY): Payer: Self-pay | Admitting: Emergency Medicine

## 2022-08-25 ENCOUNTER — Ambulatory Visit (HOSPITAL_COMMUNITY)
Admission: EM | Admit: 2022-08-25 | Discharge: 2022-08-25 | Disposition: A | Discharge status: Home / Self Care | Payer: BC Managed Care – PPO | Attending: Internal Medicine | Admitting: Internal Medicine

## 2022-08-25 DIAGNOSIS — J069 Acute upper respiratory infection, unspecified: Secondary | ICD-10-CM | POA: Diagnosis present

## 2022-08-25 DIAGNOSIS — U071 COVID-19: Secondary | ICD-10-CM | POA: Insufficient documentation

## 2022-08-25 LAB — SARS CORONAVIRUS 2 (TAT 6-24 HRS): SARS Coronavirus 2: POSITIVE — AB

## 2022-08-25 NOTE — ED Notes (Signed)
Labeled at bedside, placed in lab

## 2022-08-25 NOTE — ED Triage Notes (Signed)
Cough, sneezing, nose bleeds, chills that started Sunday.  Phlegm this morning has yellow tinge per patient  Patient takes Singulair and allegra and mucinex.    Performed a home covid test-negative on monday

## 2022-08-25 NOTE — ED Provider Notes (Signed)
[facility]   [id number] 08/25/22 Arrival Time: 9628  ASSESSMENT & PLAN:  1. Viral URI with cough    -History and exam consistent with viral URI.  Will test for COVID today.  We will call if result is positive.  General URI supportive treatments discussed including fluids, rest, Tylenol for aches, over-the-counter cough and cold medicines.  Hand hygiene encouraged.  ER precautions given.  All questions answered she agrees to plan.  No orders of the defined types were placed in this encounter.  Discharge Instructions   None       Reviewed expectations re: course of current medical issues. Questions answered. Outlined signs and symptoms indicating need for more acute intervention. Patient verbalized understanding. After Visit Summary given.   SUBJECTIVE: Pleasant 56 year old female comes urgent care to be evaluated for cough, nasal congestion, chills, and sore throat.  Symptoms started about 4 days ago.  Cough is constant and productive with yellow phlegm.  She usually has allergies but symptoms not improved with her regular home allergy medications.  She has tried Singulair, Human resources officer, and Mucinex.  She took a home COVID test on Monday that was negative.  She has no sick contacts.  She denies any fevers.  No LMP recorded. Patient has had a hysterectomy. Past Surgical History:  Procedure Laterality Date   ABDOMINAL HYSTERECTOMY     COLONOSCOPY  02/04/2016   FOREIGN BODY REMOVAL Right 01/09/2021   Procedure: REMOVAL FOREIGN BODY RIGHT RING FINGER;  Surgeon: Leanora Cover, MD;  Location: [facility];  Service: Orthopedics;  Laterality: Right;   MYOMECTOMY     SEPTOPLASTY     UTERINE FIBROID SURGERY       OBJECTIVE:  Vitals:   08/25/22 0856  BP: 126/88  Pulse: 85  Resp: 20  Temp: 99.6 F (37.6 C)  TempSrc: Oral  SpO2: 98%     Physical Exam Vitals and nursing note reviewed.  Constitutional:      Appearance: She is not ill-appearing.  HENT:     Head:  Normocephalic.     Right Ear: Tympanic membrane normal.     Left Ear: Tympanic membrane normal.     Nose: Congestion present.     Right Turbinates: Swollen.     Left Turbinates: Swollen.     Mouth/Throat:     Pharynx: Posterior oropharyngeal erythema present. No oropharyngeal exudate.  Cardiovascular:     Rate and Rhythm: Normal rate.     Heart sounds: Normal heart sounds.  Pulmonary:     Effort: Pulmonary effort is normal.  Musculoskeletal:        General: Normal range of motion.     Cervical back: Normal range of motion.  Lymphadenopathy:     Cervical: Cervical adenopathy present.      Labs: Results for orders placed or performed during the hospital encounter of 01/09/21  SARS Coronavirus 2 by RT PCR (hospital order, performed in [facility] hospital lab) Nasopharyngeal Nasopharyngeal Swab   Specimen: Nasopharyngeal Swab  Result Value Ref Range   SARS Coronavirus 2 NEGATIVE NEGATIVE  Aerobic/Anaerobic Culture (surgical/deep wound)   Specimen: PATH Other; Tissue  Result Value Ref Range   Specimen Description WOUND RIGHT FINGER    Special Requests RING FINGER    Gram Stain NO WBC SEEN RARE GRAM POSITIVE COCCI     Culture      No growth aerobically or anaerobically. Performed at [facility], [place] [address] [place], [place] [address]    Report Status 01/14/2021 FINAL  Labs Reviewed  SARS CORONAVIRUS 2 (TAT 6-24 HRS)    Imaging: No results found.   Allergies  Allergen Reactions   Other Other (See Comments)    "dyes in Triaminic"=face swelling   Red Dye Other (See Comments)   Sulfa Antibiotics                                                Past Medical History:  Diagnosis Date   Allergy    Anemia    resolved- most recent CBC 06/2018 showed no anemia   GERD (gastroesophageal reflux disease)    Heart murmur     Social History   Socioeconomic History   Marital status: Widowed    Spouse name: Not on file   Number of children: Not on file    Years of education: Not on file   Highest education level: Not on file  Occupational History   Not on file  Tobacco Use   Smoking status: Never   Smokeless tobacco: Never  Vaping Use   Vaping Use: Never used  Substance and Sexual Activity   Alcohol use: No   Drug use: No   Sexual activity: Yes    Birth control/protection: None  Other Topics Concern   Not on file  Social History Narrative   Not on file   Social Determinants of Health   Financial Resource Strain: Not on file  Food Insecurity: Not on file  Transportation Needs: Not on file  Physical Activity: Not on file  Stress: Not on file  Social Connections: Not on file  Intimate Partner Violence: Not on file    Family History  Problem Relation Age of Onset   Colon cancer Mother 90   Hypertension Father    Stomach cancer Maternal Uncle    Colon polyps Neg Hx    Esophageal cancer Neg Hx    Rectal cancer Neg Hx       Romina Divirgilio, Dorian Pod, MD 08/25/22 0947

## 2022-08-31 ENCOUNTER — Telehealth: Payer: BC Managed Care – PPO | Admitting: Physician Assistant

## 2022-08-31 DIAGNOSIS — J208 Acute bronchitis due to other specified organisms: Secondary | ICD-10-CM | POA: Diagnosis not present

## 2022-08-31 MED ORDER — PROMETHAZINE-DM 6.25-15 MG/5ML PO SYRP: 5.0000 mL | ORAL_SOLUTION | Freq: Every evening | ORAL | 0 refills | Status: DC | PRN

## 2022-08-31 MED ORDER — BENZONATATE 100 MG PO CAPS
100.0000 mg | ORAL_CAPSULE | Freq: Three times a day (TID) | ORAL | 0 refills | Status: DC | PRN
Start: 2022-08-31 — End: 2024-08-26

## 2022-08-31 NOTE — Patient Instructions (Signed)
Renee Webb, thank you for joining [name], PA-C for today's virtual visit.  While this provider is not your primary care provider (PCP), if your PCP is located in our provider database this encounter information will be shared with them immediately following your visit.  Consent: (Patient) Renee Webb provided verbal consent for this virtual visit at the beginning of the encounter.  Current Medications:  Current Outpatient Medications:    Ascorbic Acid (VITAMIN C) 500 MG CHEW, Chew 1 tablet by mouth daily., Disp: , Rfl:    Aspirin-Caffeine (BAYER BACK & BODY PO), Take by mouth., Disp: , Rfl:    azelastine (ASTELIN) 0.1 % nasal spray, azelastine 137 mcg (0.1 %) nasal spray aerosol  TAKE 2 SPRAY(S) INTRANASALLY 2 TIMES A DAY AS NEEDED FOR NASAL CONGESTION, Disp: , Rfl:    B Complex Vitamins (VITAMIN B COMPLEX) TABS, Take 1 tablet by mouth daily., Disp: , Rfl:    Calcium Carb-Cholecalciferol (CALCIUM 1000 + D PO), Take by mouth., Disp: , Rfl:    Cholecalciferol (VITAMIN D3) 1.25 MG (50000 UT) CAPS, Vitamin D3, Disp: , Rfl:    cyclobenzaprine (FLEXERIL) 5 MG tablet, , Disp: , Rfl:    ELDERBERRY PO, Take by mouth., Disp: , Rfl:    fexofenadine (ALLEGRA) 180 MG tablet, Take 180 mg by mouth daily., Disp: , Rfl:    montelukast (SINGULAIR) 10 MG tablet, Take 10 mg by mouth at bedtime., Disp: , Rfl:    Multiple Vitamins-Minerals (HAIR/SKIN/NAILS/BIOTIN PO), Hair,Skin and Nails, Disp: , Rfl:    naproxen (NAPROSYN) 500 MG tablet, , Disp: , Rfl:    traMADol (ULTRAM) 50 MG tablet, 1-2 tabs PO q6 hours prn pain (Patient not taking: Reported on 08/25/2022), Disp: 15 tablet, Rfl: 0   venlafaxine XR (EFFEXOR-XR) 37.5 MG 24 hr capsule, venlafaxine ER 37.5 mg capsule,extended release 24 hr  TAKE 1 CAPSULE BY MOUTH TWICE A DAY (Patient not taking: Reported on 08/25/2022), Disp: , Rfl:    vitamin B-12 (CYANOCOBALAMIN) 100 MCG tablet, Vitamin B12, Disp: , Rfl:    Medications ordered in this  encounter:  No orders of the defined types were placed in this encounter.    *If you need refills on other medications prior to your next appointment, please contact your pharmacy*  Follow-Up: Call back or seek an in-person evaluation if the symptoms worsen or if the condition fails to improve as anticipated.  [facility] [phone]  Other Instructions Please keep well-hydrated and get plenty of rest. Continue your vitamin supplements and OTC medications. I have added on the Tessalon which she can take up to 3 times daily for cough.  This can be used with OTC medicines like the Mucinex. I have given you a prescription cough syrup to use in the evening, near bedtime.  This is to be taken alone and not along with Mucinex, DayQuil/NyQuil, etc.  If symptoms or not resolving or you note any new or worsening symptoms despite treatment, please seek an in person evaluation ASAP.  Please do not delay care.   If you have been instructed to have an in-person evaluation today at a local Urgent Care facility, please use the link below. It will take you to a list of all of our available [facility], including address, phone number and hours of operation. Please do not delay care.  [facility]  If you or a family member do not have a primary care provider, use the link below  to schedule a visit and establish care. When you choose a [facility] primary care physician or advanced practice provider, you gain a long-term partner in health. Find a Primary Care Provider  Learn more about [facility] in-office and virtual care options: [facility]

## 2022-08-31 NOTE — Progress Notes (Signed)
Virtual Visit Consent   Renee Webb, you are scheduled for a virtual visit with a [facility] provider today. Just as with appointments in the office, your consent must be obtained to participate. Your consent will be active for this visit and any virtual visit you may have with one of our providers in the next 365 days. If you have a MyChart account, a copy of this consent can be sent to you electronically.  As this is a virtual visit, video technology does not allow for your provider to perform a traditional examination. This may limit your provider's ability to fully assess your condition. If your provider identifies any concerns that need to be evaluated in person or the need to arrange testing (such as labs, EKG, etc.), we will make arrangements to do so. Although advances in technology are sophisticated, we cannot ensure that it will always work on either your end or our end. If the connection with a video visit is poor, the visit may have to be switched to a telephone visit. With either a video or telephone visit, we are not always able to ensure that we have a secure connection.  By engaging in this virtual visit, you consent to the provision of healthcare and authorize for your insurance to be billed (if applicable) for the services provided during this visit. Depending on your insurance coverage, you may receive a charge related to this service.  I need to obtain your verbal consent now. Are you willing to proceed with your visit today? SONDRA BLIXT has provided verbal consent on 08/31/2022 for a virtual visit (video or telephone). Renee Webb, [place]  Date: 08/31/2022 5:38 PM  Virtual Visit via Video Note   I, Renee Webb, connected with  NAJLA AUGHENBAUGH  ([id number], May 18, 1966) on 08/31/22 at  5:30 PM EDT by a video-enabled telemedicine application and verified that I am speaking with the correct person using two identifiers.  Location: Patient: Virtual Visit  Location Patient: Home Provider: Virtual Visit Location Provider: Home Office   I discussed the limitations of evaluation and management by telemedicine and the availability of in person appointments. The patient expressed understanding and agreed to proceed.    History of Present Illness: Renee Webb is a 56 y.o. who identifies as a female who was assigned female at birth, and is being seen today for some ongoing cough after recent COVID infection.  Was diagnosed with COVID about a week or so ago.  At that time was experiencing body aches, sore throat, fatigue, chest congestion and cough.  Notes most symptoms have resolved but still having some persistent cough.  Was initially productive but is now more dry but has increased in frequency.  Denies fever, chest pain or shortness of breath.  Has been taking Mucinex fast max over-the-counter along with vitamin C, D3, Echinacea and zinc..  HPI: HPI  Problems:  Patient Active Problem List   Diagnosis Date Noted   Foreign body of right ring finger 01/09/2021   Mixed stress and urge urinary incontinence 09/05/2018   Seasonal allergic rhinitis 07/05/2017   Seasonal allergies 04/21/2013    Allergies:  Allergies  Allergen Reactions   Other Other (See Comments)    "dyes in Triaminic"=face swelling   Red Dye Other (See Comments)   Sulfa Antibiotics    Medications:  Current Outpatient Medications:    benzonatate (TESSALON) 100 MG capsule, Take 1 capsule (100 mg total) by mouth 3 (three) times daily as needed for  cough., Disp: 30 capsule, Rfl: 0   promethazine-dextromethorphan (PROMETHAZINE-DM) 6.25-15 MG/5ML syrup, Take 5 mLs by mouth at bedtime as needed for cough., Disp: 118 mL, Rfl: 0   Ascorbic Acid (VITAMIN C) 500 MG CHEW, Chew 1 tablet by mouth daily., Disp: , Rfl:    Aspirin-Caffeine (BAYER BACK & BODY PO), Take by mouth., Disp: , Rfl:    azelastine (ASTELIN) 0.1 % nasal spray, azelastine 137 mcg (0.1 %) nasal spray aerosol  TAKE 2  SPRAY(S) INTRANASALLY 2 TIMES A DAY AS NEEDED FOR NASAL CONGESTION, Disp: , Rfl:    B Complex Vitamins (VITAMIN B COMPLEX) TABS, Take 1 tablet by mouth daily., Disp: , Rfl:    Calcium Carb-Cholecalciferol (CALCIUM 1000 + D PO), Take by mouth., Disp: , Rfl:    Cholecalciferol (VITAMIN D3) 1.25 MG (50000 UT) CAPS, Vitamin D3, Disp: , Rfl:    cyclobenzaprine (FLEXERIL) 5 MG tablet, , Disp: , Rfl:    ELDERBERRY PO, Take by mouth., Disp: , Rfl:    fexofenadine (ALLEGRA) 180 MG tablet, Take 180 mg by mouth daily., Disp: , Rfl:    montelukast (SINGULAIR) 10 MG tablet, Take 10 mg by mouth at bedtime., Disp: , Rfl:    Multiple Vitamins-Minerals (HAIR/SKIN/NAILS/BIOTIN PO), Hair,Skin and Nails, Disp: , Rfl:    naproxen (NAPROSYN) 500 MG tablet, , Disp: , Rfl:    traMADol (ULTRAM) 50 MG tablet, 1-2 tabs PO q6 hours prn pain (Patient not taking: Reported on 08/25/2022), Disp: 15 tablet, Rfl: 0   venlafaxine XR (EFFEXOR-XR) 37.5 MG 24 hr capsule, venlafaxine ER 37.5 mg capsule,extended release 24 hr  TAKE 1 CAPSULE BY MOUTH TWICE A DAY (Patient not taking: Reported on 08/25/2022), Disp: , Rfl:    vitamin B-12 (CYANOCOBALAMIN) 100 MCG tablet, Vitamin B12, Disp: , Rfl:   Observations/Objective: Patient is well-developed, well-nourished in no acute distress.  Resting comfortably at home.  Head is normocephalic, atraumatic.  No labored breathing. Speech is clear and coherent with logical content.  Patient is alert and oriented at baseline.   Assessment and Plan: 1. Acute viral bronchitis - benzonatate (TESSALON) 100 MG capsule; Take 1 capsule (100 mg total) by mouth 3 (three) times daily as needed for cough.  Dispense: 30 capsule; Refill: 0 - promethazine-dextromethorphan (PROMETHAZINE-DM) 6.25-15 MG/5ML syrup; Take 5 mLs by mouth at bedtime as needed for cough.  Dispense: 118 mL; Refill: 0  After recent COVID infection.  Thankfully most symptoms have resolved.  Suspect cough due to lingering inflammation  from viral infection.  Supportive measures and OTC medications reviewed.  We will add on Tessalon to use as directed.  We will give promethazine DM cough syrup to use at nighttime only as it can make her sleepy.  Strict in person follow-up precautions reviewed with patient.  Follow Up Instructions: I discussed the assessment and treatment plan with the patient. The patient was provided an opportunity to ask questions and all were answered. The patient agreed with the plan and demonstrated an understanding of the instructions.  A copy of instructions were sent to the patient via MyChart unless otherwise noted below.   The patient was advised to call back or seek an in-person evaluation if the symptoms worsen or if the condition fails to improve as anticipated.  Time:  I spent 10 minutes with the patient via telehealth technology discussing the above problems/concerns.    Renee Rio, PA-C

## 2022-11-22 ENCOUNTER — Emergency Department (HOSPITAL_BASED_OUTPATIENT_CLINIC_OR_DEPARTMENT_OTHER)
Admission: EM | Admit: 2022-11-22 | Discharge: 2022-11-22 | Disposition: A | Discharge status: Home / Self Care | Payer: BC Managed Care – PPO | Attending: Emergency Medicine | Admitting: Emergency Medicine

## 2022-11-22 ENCOUNTER — Emergency Department (HOSPITAL_BASED_OUTPATIENT_CLINIC_OR_DEPARTMENT_OTHER): Payer: BC Managed Care – PPO

## 2022-11-22 ENCOUNTER — Other Ambulatory Visit: Payer: Self-pay

## 2022-11-22 ENCOUNTER — Encounter (HOSPITAL_BASED_OUTPATIENT_CLINIC_OR_DEPARTMENT_OTHER): Payer: Self-pay

## 2022-11-22 DIAGNOSIS — Z7982 Long term (current) use of aspirin: Secondary | ICD-10-CM | POA: Insufficient documentation

## 2022-11-22 DIAGNOSIS — R1032 Left lower quadrant pain: Secondary | ICD-10-CM | POA: Diagnosis not present

## 2022-11-22 DIAGNOSIS — Z20822 Contact with and (suspected) exposure to covid-19: Secondary | ICD-10-CM | POA: Diagnosis not present

## 2022-11-22 DIAGNOSIS — R197 Diarrhea, unspecified: Secondary | ICD-10-CM | POA: Insufficient documentation

## 2022-11-22 DIAGNOSIS — R110 Nausea: Secondary | ICD-10-CM | POA: Insufficient documentation

## 2022-11-22 LAB — URINALYSIS, ROUTINE W REFLEX MICROSCOPIC
Bacteria, UA: NONE SEEN
Bilirubin Urine: NEGATIVE
Glucose, UA: NEGATIVE mg/dL
Ketones, ur: NEGATIVE mg/dL
Nitrite: NEGATIVE
Protein, ur: 30 mg/dL — AB
Specific Gravity, Urine: 1.031 — ABNORMAL HIGH (ref 1.005–1.030)
pH: 6 (ref 5.0–8.0)

## 2022-11-22 LAB — COMPREHENSIVE METABOLIC PANEL
ALT: 46 U/L — ABNORMAL HIGH (ref 0–44)
AST: 29 U/L (ref 15–41)
Albumin: 4.4 g/dL (ref 3.5–5.0)
Alkaline Phosphatase: 113 U/L (ref 38–126)
Anion gap: 13 (ref 5–15)
BUN: 18 mg/dL (ref 6–20)
CO2: 25 mmol/L (ref 22–32)
Calcium: 9.6 mg/dL (ref 8.9–10.3)
Chloride: 100 mmol/L (ref 98–111)
Creatinine, Ser: 0.99 mg/dL (ref 0.44–1.00)
GFR, Estimated: >60 mL/min (ref >60)
Glucose, Bld: 100 mg/dL — ABNORMAL HIGH (ref 70–99)
Potassium: 3.2 mmol/L — ABNORMAL LOW (ref 3.5–5.1)
Sodium: 138 mmol/L (ref 135–145)
Total Bilirubin: 0.7 mg/dL (ref 0.3–1.2)
Total Protein: 8 g/dL (ref 6.5–8.1)

## 2022-11-22 LAB — CBC
HCT: 41.7 % (ref 36.0–46.0)
Hemoglobin: 14 g/dL (ref 12.0–15.0)
MCH: 28.1 pg (ref 26.0–34.0)
MCHC: 33.6 g/dL (ref 30.0–36.0)
MCV: 83.6 fL (ref 80.0–100.0)
Platelets: 203 10*3/uL (ref 150–400)
RBC: 4.99 MIL/uL (ref 3.87–5.11)
RDW: 12.6 % (ref 11.5–15.5)
WBC: 3.6 10*3/uL — ABNORMAL LOW (ref 4.0–10.5)
nRBC: 0 % (ref 0.0–0.2)

## 2022-11-22 LAB — RESP PANEL BY RT-PCR (RSV, FLU A&B, COVID)  RVPGX2
Influenza A by PCR: NEGATIVE
Influenza B by PCR: NEGATIVE
Resp Syncytial Virus by PCR: NEGATIVE
SARS Coronavirus 2 by RT PCR: NEGATIVE

## 2022-11-22 LAB — PREGNANCY, URINE: Preg Test, Ur: NEGATIVE

## 2022-11-22 LAB — LIPASE, BLOOD: Lipase: 38 U/L (ref 11–51)

## 2022-11-22 MED ORDER — ONDANSETRON HCL 4 MG/2ML IJ SOLN
4.0000 mg | Freq: Once | INTRAMUSCULAR | Status: AC
  Administered 2022-11-22: 4 mg via INTRAVENOUS
  Filled 2022-11-22: qty 2

## 2022-11-22 MED ORDER — IOHEXOL 300 MG/ML  SOLN
80.0000 mL | Freq: Once | INTRAMUSCULAR | Status: AC | PRN
  Administered 2022-11-22: 80 mL via INTRAVENOUS

## 2022-11-22 MED ORDER — POTASSIUM CHLORIDE CRYS ER 20 MEQ PO TBCR
40.0000 meq | EXTENDED_RELEASE_TABLET | Freq: Once | ORAL | Status: AC
  Administered 2022-11-22: 40 meq via ORAL
  Filled 2022-11-22: qty 2

## 2022-11-22 MED ORDER — ONDANSETRON HCL 4 MG PO TABS: 4.0000 mg | ORAL_TABLET | Freq: Four times a day (QID) | ORAL | 0 refills | Status: DC

## 2022-11-22 MED ORDER — POTASSIUM CHLORIDE CRYS ER 20 MEQ PO TBCR: 30.0000 meq | EXTENDED_RELEASE_TABLET | Freq: Once | ORAL | Status: DC

## 2022-11-22 MED ORDER — SODIUM CHLORIDE 0.9 % IV BOLUS
1000.0000 mL | Freq: Once | INTRAVENOUS | Status: AC
  Administered 2022-11-22: 1000 mL via INTRAVENOUS

## 2022-11-22 NOTE — ED Provider Notes (Signed)
[facility] EMERGENCY DEPT Provider Note   CSN: [id number] Arrival date & time: 11/22/22  1058     History  Chief Complaint  Patient presents with   Diarrhea    Renee Webb is a 56 y.o. female who presents emergency department with concerns for diarrhea onset 4 days.  Denies sick contacts.  Notes that she took a laxative 3 days ago.  Has had up to 3-4 watery nonbloody diarrhea episodes a day.  Has tried over-the-counter antiemetics.  Has nausea.  No past medical history of diverticulitis.  Has associated left lower quadrant pain.  Denies vomiting, chest pain, shortness of breath, fever, nasal congestion, rhinorrhea, sneezing.   The history is provided by the patient. No language interpreter was used.       Home Medications Prior to Admission medications   Medication Sig Start Date End Date Taking? Authorizing Provider  ondansetron (ZOFRAN) 4 MG tablet Take 1 tablet (4 mg total) by mouth every 6 (six) hours. 11/22/22  Yes Alene Bergerson A, PA-C  Ascorbic Acid (VITAMIN C) 500 MG CHEW Chew 1 tablet by mouth daily.    [provider]  Aspirin-Caffeine (BAYER BACK & BODY PO) Take by mouth.    [provider]  azelastine (ASTELIN) 0.1 % nasal spray azelastine 137 mcg (0.1 %) nasal spray aerosol  TAKE 2 SPRAY(S) INTRANASALLY 2 TIMES A DAY AS NEEDED FOR NASAL CONGESTION    [provider]  B Complex Vitamins (VITAMIN B COMPLEX) TABS Take 1 tablet by mouth daily.    [provider]  benzonatate (TESSALON) 100 MG capsule Take 1 capsule (100 mg total) by mouth 3 (three) times daily as needed for cough. 08/31/22   Brunetta Jeans, PA-C  Calcium Carb-Cholecalciferol (CALCIUM 1000 + D PO) Take by mouth.    [provider]  Cholecalciferol (VITAMIN D3) 1.25 MG (50000 UT) CAPS Vitamin D3    [provider]  cyclobenzaprine (FLEXERIL) 5 MG tablet     [provider]  ELDERBERRY PO Take by mouth.    [provider]  fexofenadine (ALLEGRA) 180 MG tablet Take 180 mg by mouth daily.    [provider]  montelukast (SINGULAIR) 10 MG tablet Take 10 mg by mouth at bedtime.    [provider]  Multiple Vitamins-Minerals (HAIR/SKIN/NAILS/BIOTIN PO) Hair,Skin and Nails 12/24/20   [provider]  naproxen (NAPROSYN) 500 MG tablet     [provider]  promethazine-dextromethorphan (PROMETHAZINE-DM) 6.25-15 MG/5ML syrup Take 5 mLs by mouth at bedtime as needed for cough. 08/31/22   Brunetta Jeans, PA-C  traMADol Veatrice Bourbon) 50 MG tablet 1-2 tabs PO q6 hours prn pain Patient not taking: Reported on 08/25/2022 01/09/21   Leanora Cover, MD  venlafaxine XR (EFFEXOR-XR) 37.5 MG 24 hr capsule venlafaxine ER 37.5 mg capsule,extended release 24 hr  TAKE 1 CAPSULE BY MOUTH TWICE A DAY Patient not taking: Reported on 08/25/2022    [provider]  vitamin B-12 (CYANOCOBALAMIN) 100 MCG tablet Vitamin B12    [provider]  SERTRALINE HCL PO Take by mouth.  08/11/20  [provider]      Allergies    Other, Red dye, and Sulfa antibiotics    Review of Systems   Review of Systems  Constitutional:  Negative for fever.  HENT:  Negative for congestion, rhinorrhea and sneezing.   Respiratory:  Negative for shortness of breath.   Cardiovascular:  Negative for chest pain.  Gastrointestinal:  Positive for abdominal  pain (LLQ), diarrhea and nausea. Negative for vomiting.  All other systems reviewed and are negative.   Physical Exam Updated Vital Signs BP (!) 118/102 (BP Location: Right Arm)   Pulse 83   Temp 99 F (37.2 C) (Oral)   Resp 16   Ht '5\' 11"'$  (1.803 m)   Wt 96.6 kg   SpO2 100%   BMI 29.71 kg/m  Physical Exam Vitals and nursing note reviewed.  Constitutional:      General: She is not in acute distress.    Appearance: She is not diaphoretic.  HENT:     Head: Normocephalic and atraumatic.     Mouth/Throat:     Pharynx: No oropharyngeal  exudate.  Eyes:     General: No scleral icterus.    Conjunctiva/sclera: Conjunctivae normal.  Cardiovascular:     Rate and Rhythm: Normal rate and regular rhythm.     Pulses: Normal pulses.     Heart sounds: Normal heart sounds.  Pulmonary:     Effort: Pulmonary effort is normal. No respiratory distress.     Breath sounds: Normal breath sounds. No wheezing.  Abdominal:     General: Bowel sounds are normal.     Palpations: Abdomen is soft. There is no mass.     Tenderness: There is abdominal tenderness. There is no guarding or rebound.     Comments: Mild abdominal tenderness to palpation diffusely.  Musculoskeletal:        General: Normal range of motion.     Cervical back: Normal range of motion and neck supple.  Skin:    General: Skin is warm and dry.  Neurological:     Mental Status: She is alert.  Psychiatric:        Behavior: Behavior normal.     ED Results / Procedures / Treatments   Labs (all labs ordered are listed, but only abnormal results are displayed) Labs Reviewed  COMPREHENSIVE METABOLIC PANEL - Abnormal; Notable for the following components:      Result Value   Potassium 3.2 (*)    Glucose, Bld 100 (*)    ALT 46 (*)    All other components within normal limits  CBC - Abnormal; Notable for the following components:   WBC 3.6 (*)    All other components within normal limits  URINALYSIS, ROUTINE W REFLEX MICROSCOPIC - Abnormal; Notable for the following components:   Specific Gravity, Urine 1.031 (*)    Hgb urine dipstick TRACE (*)    Protein, ur 30 (*)    Leukocytes,Ua SMALL (*)    All other components within normal limits  RESP PANEL BY RT-PCR (RSV, FLU A&B, COVID)  RVPGX2  LIPASE, BLOOD  PREGNANCY, URINE    EKG None  Radiology CT ABDOMEN PELVIS W CONTRAST  Result Date: 11/22/2022 CLINICAL DATA:  Left lower quadrant abdominal pain EXAM: CT ABDOMEN AND PELVIS WITH CONTRAST TECHNIQUE: Multidetector CT imaging of the abdomen and pelvis was  performed using the standard protocol following bolus administration of intravenous contrast. RADIATION DOSE REDUCTION: This exam was performed according to the departmental dose-optimization program which includes automated exposure control, adjustment of the mA and/or kV according to patient size and/or use of iterative reconstruction technique. CONTRAST:  57m OMNIPAQUE IOHEXOL 300 MG/ML  SOLN COMPARISON:  None Available. FINDINGS: Lower chest: No acute abnormality. Hepatobiliary: No focal liver abnormality is seen. No gallstones, gallbladder wall thickening, or biliary dilatation. Pancreas: Unremarkable. No pancreatic ductal dilatation or surrounding inflammatory changes. Spleen: Normal in size without focal  abnormality. Adrenals/Urinary Tract: Bilateral adrenal glands are unremarkable. No hydronephrosis or nephrolithiasis. Bladder is unremarkable. Stomach/Bowel: Stomach is within normal limits. No evidence of bowel wall thickening, distention, or inflammatory changes. Vascular/Lymphatic: Aortic atherosclerosis. Focal mesenteric fat stranding with associated prominent subcentimeter mesenteric lymph nodes. Reproductive: No adnexal masses. Other: No abdominal wall hernia or abnormality. No abdominopelvic ascites. Musculoskeletal: No acute or significant osseous findings. IMPRESSION: 1. Focal mesenteric fat stranding with associated prominent subcentimeter mesenteric lymph nodes. Findings are nonspecific but can be seen in the setting of sclerosing mesenteritis. 2. Otherwise, no acute findings in the abdomen pelvis. 3. Aortic Atherosclerosis (ICD10-I70.0). Electronically Signed   By: Yetta Glassman M.D.   On: 11/22/2022 14:04    Procedures Procedures    Medications Ordered in ED Medications  sodium chloride 0.9 % bolus 1,000 mL (0 mLs Intravenous Stopped 11/22/22 1452)  ondansetron (ZOFRAN) injection 4 mg (4 mg Intravenous Given 11/22/22 1312)  iohexol (OMNIPAQUE) 300 MG/ML solution 80 mL (80 mLs  Intravenous Contrast Given 11/22/22 1338)  potassium chloride SA (KLOR-CON M) CR tablet 40 mEq (40 mEq Oral Given 11/22/22 1506)    ED Course/ Medical Decision Making/ A&P                            Medical Decision Making Amount and/or Complexity of Data Reviewed Labs: ordered. Radiology: ordered.  Risk Prescription drug management.   Patient presents to the emergency department with concerns for diarrhea onset 4 days.  With left lower quadrant pain.  Tried over-the-counter laxatives and antiemetics for her symptoms.  Patient afebrile.  On exam patient with mild diffuse abdominal tenderness to palpation noted on exam.  Remainder of exam without acute findings. Differential diagnosis includes pancreatitis, appendicitis, diverticulitis, viral etiology diarrhea.  Labs:  I ordered, and personally interpreted labs.  The pertinent results include:  Negative COVID, flu, RSV swab. Lipase unremarkable. CBC without leukocytosis. Negative pregnancy urine. CMP with hypokalemia at 3.2 otherwise unremarkable. Urinalysis notable for trace amount of hemoglobin otherwise nitrate negative.  Imaging: I ordered imaging studies including CT abdomen pelvis with I independently visualized and interpreted imaging which showed  1. Focal mesenteric fat stranding with associated prominent  subcentimeter mesenteric lymph nodes. Findings are nonspecific but  can be seen in the setting of sclerosing mesenteritis.  2. Otherwise, no acute findings in the abdomen pelvis.  3. Aortic Atherosclerosis (ICD10-I70.0).   I agree with the radiologist interpretation  Medications:  I ordered medication including IV fluids, Zofran, potassium for symptom management. Reevaluation of the patient after these medicines and interventions, I reevaluated the patient and found that they have improved I have reviewed the patients home medicines and have made adjustments as needed   Disposition: Presenting suspicious for  viral etiology as cause of diarrhea.  Doubt pancreatitis, appendicitis, diverticulitis at this time.. After consideration of the diagnostic results and the patients response to treatment, I feel that the patient would benefit from Discharge home.  Patient will be sent home with a prescription for Zofran.  Provided with information for on-call GI specialist to follow-up regarding incidental nonspecific findings found on CT today.  Supportive care measures and strict return precautions discussed with patient at bedside. Pt acknowledges and verbalizes understanding. Pt appears safe for discharge. Follow up as indicated in discharge paperwork.   This chart was dictated using voice recognition software, Dragon. Despite the best efforts of this provider to proofread and correct errors, errors may still occur which can change  documentation meaning.  Final Clinical Impression(s) / ED Diagnoses Final diagnoses:  Diarrhea, unspecified type    Rx / DC Orders ED Discharge Orders          Ordered    ondansetron (ZOFRAN) 4 MG tablet  Every 6 hours        11/22/22 1452              Lenzi Marmo A, PA-C 11/22/22 1851    Elgie Congo, MD 11/23/22 [phone]

## 2022-11-22 NOTE — ED Triage Notes (Signed)
Pt states that she has diarrhea since Friday. Pt states that she took a laxative Saturday.   Pt states she has had 4 episodes of diarrhea last 24 hours. Pt has been drinking Gatorade.

## 2022-11-22 NOTE — Discharge Instructions (Signed)
It is a pleasure taking care of you today!  Your labs today were overall unremarkable.  Your CT scan did not show any emergent concerning findings today, did show concerns for nonspecific findings.  These can be followed with the GI specialist.  Attached is information for diet for diarrhea.  Ensure to maintain fluid intake.  Return to the emergency department if you experience increasing/worsening symptoms.

## 2022-12-30 IMAGING — MG DIGITAL DIAGNOSTIC BILAT W/ TOMO W/ CAD
8 series · 8 of 24 positions shown · non-contrast
Comparison: Previous exam(s).

CLINICAL DATA: Screening recall for bilateral breast asymmetries.

EXAM:
DIGITAL DIAGNOSTIC BILATERAL MAMMOGRAM WITH TOMOSYNTHESIS AND CAD;
ULTRASOUND RIGHT BREAST LIMITED
TECHNIQUE: Bilateral digital diagnostic mammography and breast tomosynthesis
was performed. The images were evaluated with computer-aided
detection.; Targeted ultrasound examination of the right breast was
performed

[R CC synth-2D]
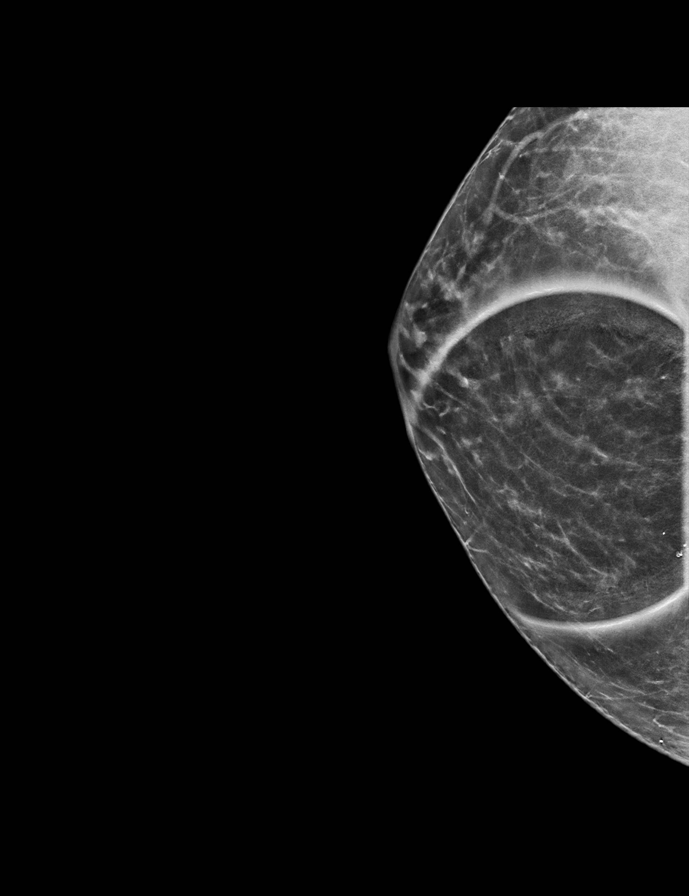

[L ML synth-2D]
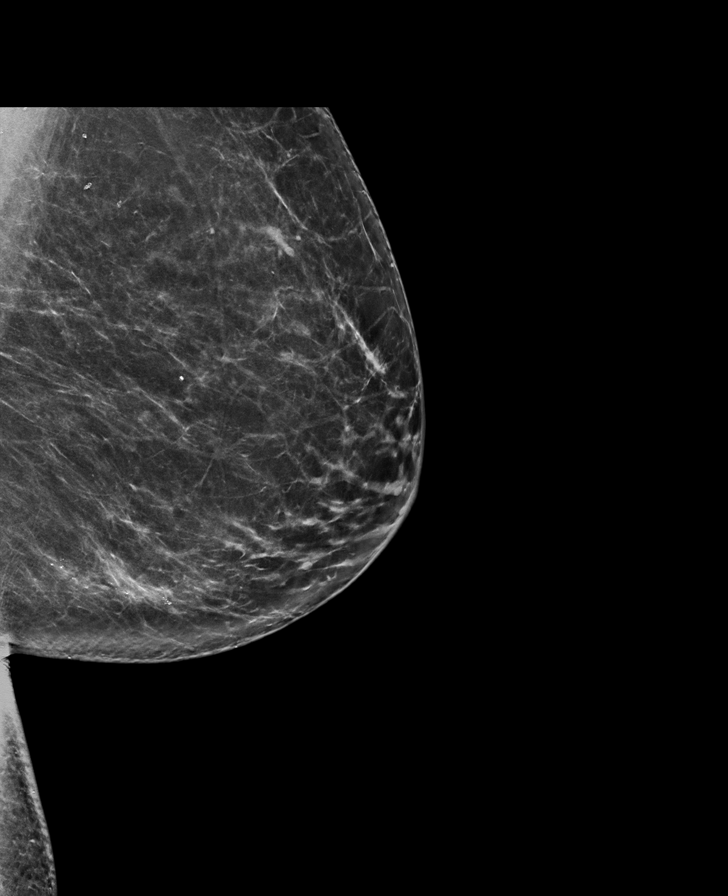

[L CC synth-2D]
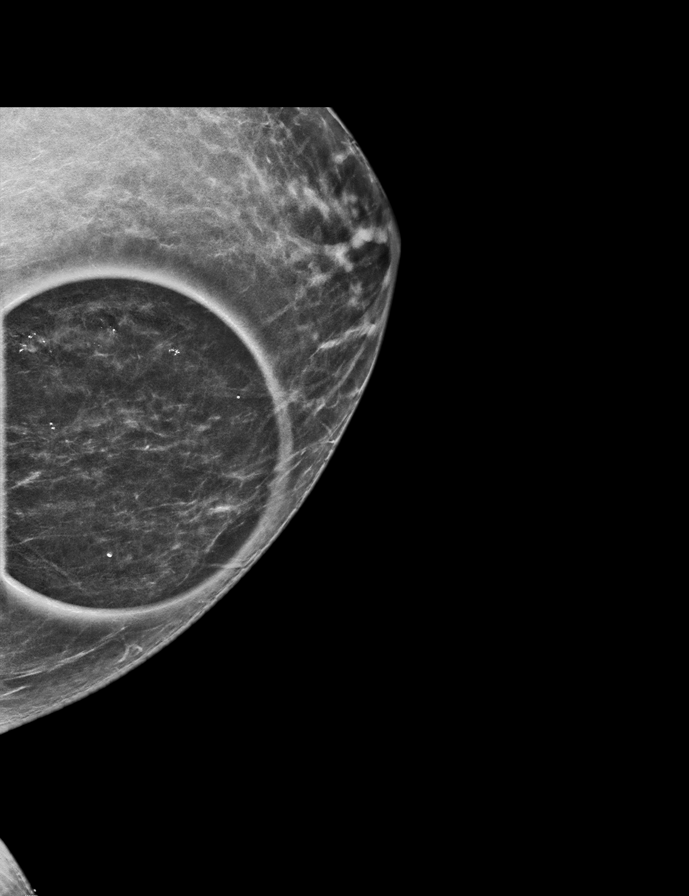

[R MLO synth-2D]
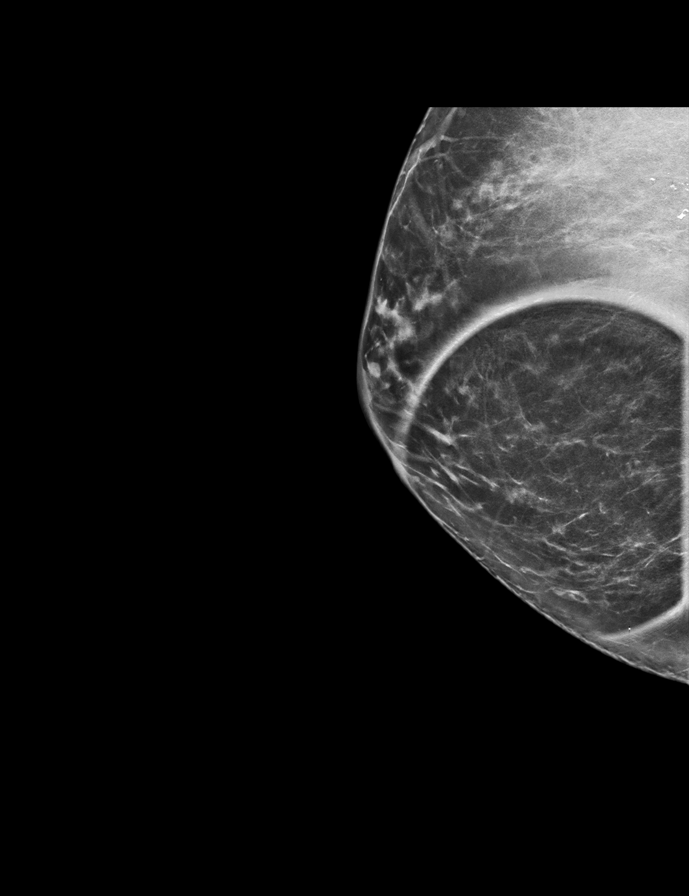

[L CC tomo · tomo slice 31/62.0]
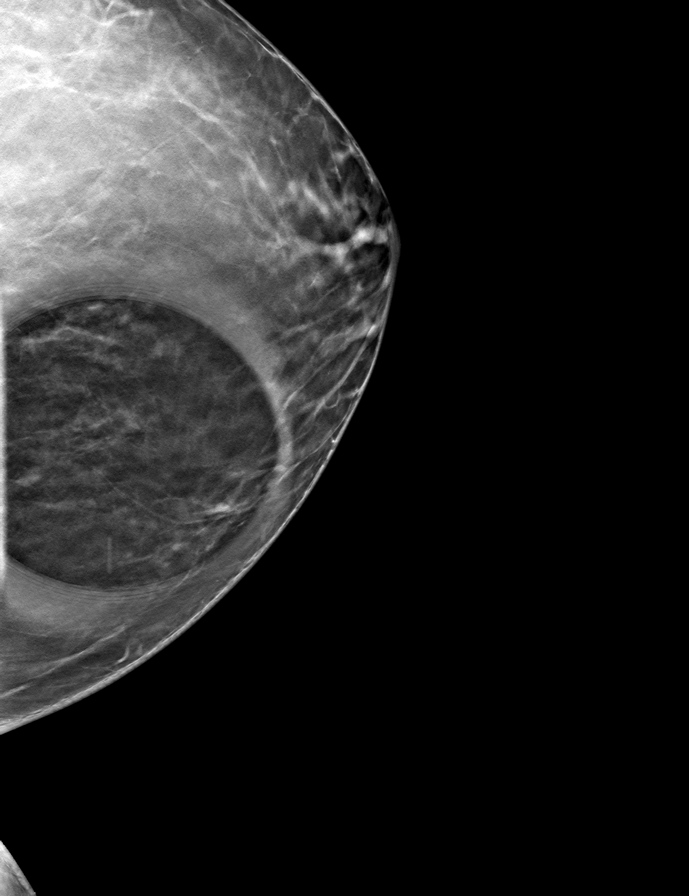

[R MLO tomo · tomo slice 36/71.0]
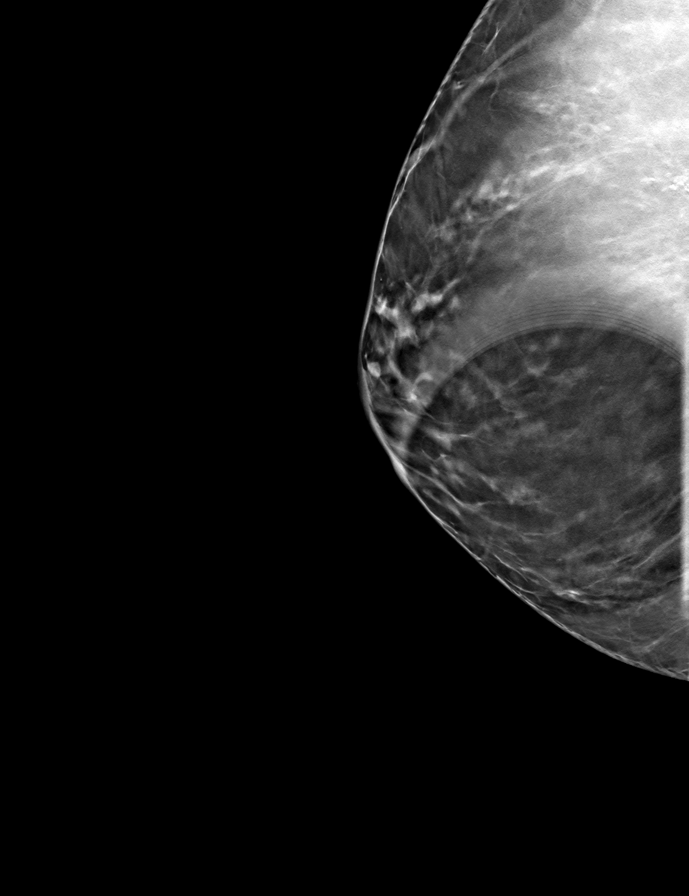

[L ML tomo · tomo slice 42/83.0]
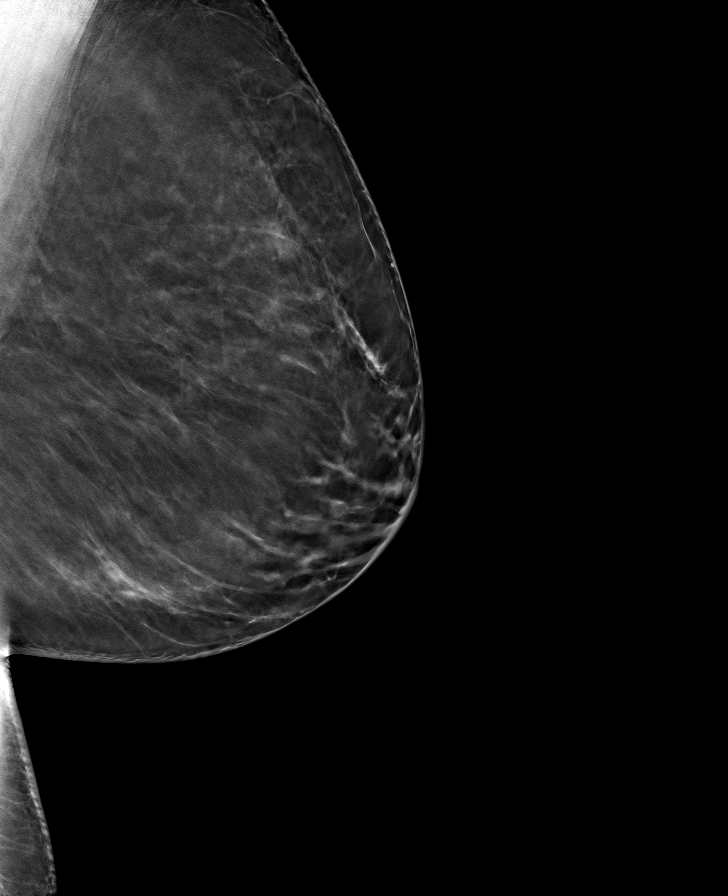

[R CC tomo · tomo slice 31/61.0]
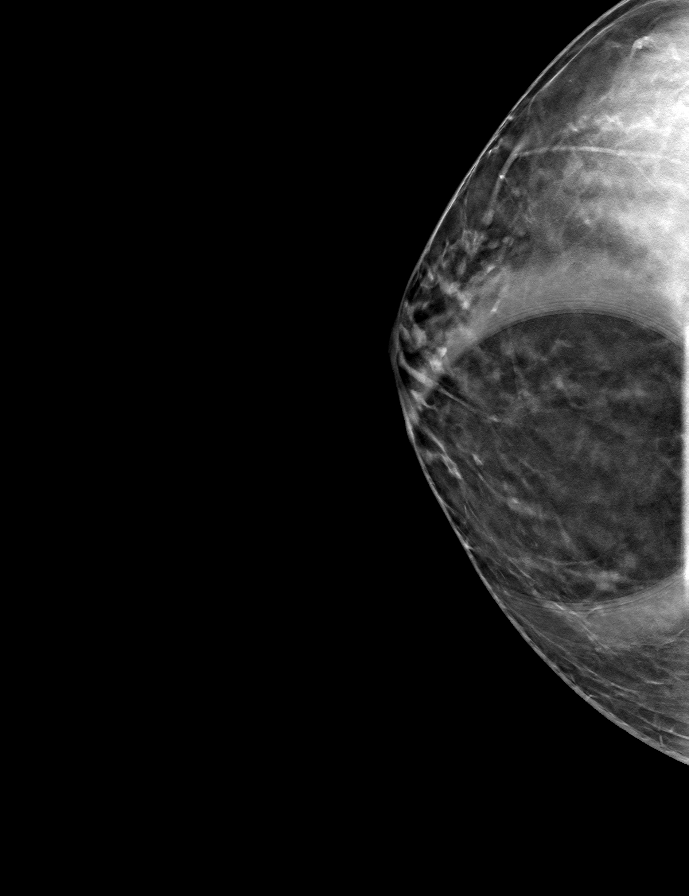

[8 of 24 positions shown; findings below may reference images not displayed]

ACR Breast Density Category b: There are scattered areas of
fibroglandular density.
FINDINGS: Spot compression tomosynthesis images of the lower inner right
breast demonstrates a possible subcentimeter oval mass in the
retroareolar breast. The question mass in the medial left breast
resolves on spot compression tomosynthesis imaging.

Ultrasound targeted to the lower-inner quadrant of the right breast
demonstrates a few benign-appearing anechoic cysts measuring 3 mm
respectively. No suspicious solid appearing masses are identified.
IMPRESSION: 1. There is an asymmetry in the lower-inner right breast without a
definite sonographic correlate. This finding is likely benign.

2. Resolution of the left breast asymmetry consistent with
overlapping fibroglandular tissue.

RECOMMENDATION:
Six-month follow-up diagnostic right breast mammogram.

I have discussed the findings and recommendations with the patient.
If applicable, a reminder letter will be sent to the patient
regarding the next appointment.

BI-RADS CATEGORY  3: Probably benign.

## 2023-01-27 ENCOUNTER — Ambulatory Visit
Admission: RE | Admit: 2023-01-27 | Discharge: 2023-01-27 | Disposition: A | Discharge status: Home / Self Care | Payer: BC Managed Care – PPO | Source: Ambulatory Visit | Attending: Obstetrics & Gynecology | Admitting: Obstetrics & Gynecology

## 2023-01-27 DIAGNOSIS — N6489 Other specified disorders of breast: Secondary | ICD-10-CM

## 2023-12-15 ENCOUNTER — Other Ambulatory Visit: Payer: Self-pay | Admitting: Obstetrics & Gynecology

## 2023-12-15 DIAGNOSIS — N6310 Unspecified lump in the right breast, unspecified quadrant: Secondary | ICD-10-CM

## 2024-01-30 ENCOUNTER — Other Ambulatory Visit: Payer: Self-pay | Admitting: Obstetrics & Gynecology

## 2024-01-30 ENCOUNTER — Ambulatory Visit
Admission: RE | Admit: 2024-01-30 | Discharge: 2024-01-30 | Disposition: A | Discharge status: Home / Self Care | Payer: BC Managed Care – PPO | Source: Ambulatory Visit | Attending: Obstetrics & Gynecology | Admitting: Obstetrics & Gynecology

## 2024-01-30 ENCOUNTER — Ambulatory Visit
Admission: RE | Admit: 2024-01-30 | Discharge: 2024-01-30 | Disposition: A | Discharge status: Home / Self Care | Source: Ambulatory Visit | Attending: Obstetrics & Gynecology | Admitting: Obstetrics & Gynecology

## 2024-01-30 DIAGNOSIS — N6310 Unspecified lump in the right breast, unspecified quadrant: Secondary | ICD-10-CM

## 2024-02-02 ENCOUNTER — Ambulatory Visit
Admission: RE | Admit: 2024-02-02 | Discharge: 2024-02-02 | Disposition: A | Discharge status: Home / Self Care | Source: Ambulatory Visit | Attending: Obstetrics & Gynecology | Admitting: Obstetrics & Gynecology

## 2024-02-02 DIAGNOSIS — N6310 Unspecified lump in the right breast, unspecified quadrant: Secondary | ICD-10-CM

## 2024-02-02 HISTORY — PX: BREAST BIOPSY: SHX20

## 2024-02-03 LAB — SURGICAL PATHOLOGY

## 2024-08-26 ENCOUNTER — Encounter (HOSPITAL_COMMUNITY): Payer: Self-pay

## 2024-08-26 ENCOUNTER — Ambulatory Visit (INDEPENDENT_AMBULATORY_CARE_PROVIDER_SITE_OTHER)

## 2024-08-26 ENCOUNTER — Ambulatory Visit (HOSPITAL_COMMUNITY): Payer: Self-pay | Admitting: Internal Medicine

## 2024-08-26 ENCOUNTER — Ambulatory Visit (HOSPITAL_COMMUNITY)
Admission: EM | Admit: 2024-08-26 | Discharge: 2024-08-26 | Disposition: A | Discharge status: Home / Self Care | Attending: Internal Medicine | Admitting: Internal Medicine

## 2024-08-26 DIAGNOSIS — E860 Dehydration: Secondary | ICD-10-CM | POA: Diagnosis not present

## 2024-08-26 DIAGNOSIS — R109 Unspecified abdominal pain: Secondary | ICD-10-CM | POA: Diagnosis present

## 2024-08-26 LAB — CBC
HCT: 41.6 % (ref 36.0–46.0)
Hemoglobin: 13.4 g/dL (ref 12.0–15.0)
MCH: 27 pg (ref 26.0–34.0)
MCHC: 32.2 g/dL (ref 30.0–36.0)
MCV: 83.7 fL (ref 80.0–100.0)
Platelets: 263 K/uL (ref 150–400)
RBC: 4.97 MIL/uL (ref 3.87–5.11)
RDW: 13.2 % (ref 11.5–15.5)
WBC: 7.2 K/uL (ref 4.0–10.5)
nRBC: 0 % (ref 0.0–0.2)

## 2024-08-26 LAB — POCT URINALYSIS DIP (MANUAL ENTRY)
Bilirubin, UA: NEGATIVE
Blood, UA: NEGATIVE
Glucose, UA: NEGATIVE mg/dL
Ketones, POC UA: NEGATIVE mg/dL
Leukocytes, UA: NEGATIVE
Nitrite, UA: NEGATIVE
Protein Ur, POC: NEGATIVE mg/dL
Spec Grav, UA: >=1.03 — AB (ref 1.010–1.025)
Urobilinogen, UA: 0.2 U/dL
pH, UA: 5 (ref 5.0–8.0)

## 2024-08-26 LAB — COMPREHENSIVE METABOLIC PANEL WITH GFR
ALT: 25 U/L (ref 0–44)
AST: 22 U/L (ref 15–41)
Albumin: 3.5 g/dL (ref 3.5–5.0)
Alkaline Phosphatase: 103 U/L (ref 38–126)
Anion gap: 10 (ref 5–15)
BUN: 12 mg/dL (ref 6–20)
CO2: 26 mmol/L (ref 22–32)
Calcium: 9.4 mg/dL (ref 8.9–10.3)
Chloride: 104 mmol/L (ref 98–111)
Creatinine, Ser: 1.02 mg/dL — ABNORMAL HIGH (ref 0.44–1.00)
GFR, Estimated: >60 mL/min (ref >60)
Glucose, Bld: 118 mg/dL — ABNORMAL HIGH (ref 70–99)
Potassium: 3.6 mmol/L (ref 3.5–5.1)
Sodium: 140 mmol/L (ref 135–145)
Total Bilirubin: 0.9 mg/dL (ref 0.0–1.2)
Total Protein: 7.8 g/dL (ref 6.5–8.1)

## 2024-08-26 LAB — LIPASE, BLOOD: Lipase: 13 U/L (ref 11–51)

## 2024-08-26 NOTE — Discharge Instructions (Addendum)
 Urinalysis done today shows some mild dehydration but is otherwise normal.  X-ray of the abdomen done today does show some mild constipation as well as bowel gas.  We have drawn labs today including a complete metabolic panel, complete blood count and lipase.  Vital signs and physical exam findings are reassuring as is the slight improvement in abdominal pain.  At this time recommend monitoring symptoms at home and awaiting finalization of the lab work which could take up to 24 hours.  If there are any significant abnormalities we will contact you and likely recommend following up in the emergency room.  If your symptoms worsen at any time then recommend going to the emergency room immediately for further evaluation as we are limited on our imaging abilities here at urgent care.  Recommend staying hydrated and drinking plenty of fluids.

## 2024-08-26 NOTE — Telephone Encounter (Signed)
 Called patient by phone.  After verifying name and date of birth and notify patient that her results were essentially within normal limits.  Recommended continuing to monitor her symptoms and if there is any worsening go to emergency room.  Patient expresses understanding.

## 2024-08-26 NOTE — ED Provider Notes (Signed)
 [facility]    CSN: [id number] Arrival date & time: 08/26/24  1336      History   Chief Complaint Chief Complaint  Patient presents with   Abdominal Pain    HPI Renee Webb is a 58 y.o. female.   58 year old female who presents urgent care with complaints left flank pain.  She reports that this started this morning.  This morning she thought that it might be secondary to needing to use the bathroom but she had a bowel movement this morning and it did not get better.  It was somewhat mild at first.  She went to lunch today and was unable to eat much due to the severe pain.  She left and came here for further evaluation.  She has had a little bit of nausea and vomiting.  She has not had any diarrhea, fevers, chills, history of abdominal pain, dysuria, hematuria, history of kidney stones, recent illnesses.  She was in her normal health yesterday.  The pain is not positional.   Abdominal Pain Associated symptoms: nausea and vomiting   Associated symptoms: no chest pain, no chills, no constipation, no cough, no diarrhea, no dysuria, no fever, no hematuria, no shortness of breath and no sore throat     Past Medical History:  Diagnosis Date   Allergy    Anemia    resolved- most recent CBC 06/2018 showed no anemia   GERD (gastroesophageal reflux disease)    Heart murmur     Patient Active Problem List   Diagnosis Date Noted   Foreign body of right ring finger 01/09/2021   Mixed stress and urge urinary incontinence 09/05/2018   Seasonal allergic rhinitis 07/05/2017   Seasonal allergies 04/21/2013    Past Surgical History:  Procedure Laterality Date   ABDOMINAL HYSTERECTOMY     BREAST BIOPSY Right 10/27/2020   BREAST BIOPSY Right 02/02/2024   US  RT BREAST BX W LOC DEV 1ST LESION IMG BX SPEC US  GUIDE 02/02/2024 [facility]   COLONOSCOPY  02/04/2016   FOREIGN BODY REMOVAL Right 01/09/2021   Procedure: REMOVAL FOREIGN BODY RIGHT RING FINGER;  Surgeon: Murrell Drivers, MD;  Location: [facility];  Service: Orthopedics;  Laterality: Right;   MYOMECTOMY     SEPTOPLASTY     UTERINE FIBROID SURGERY      OB History   No obstetric history on file.      Home Medications    Prior to Admission medications   Medication Sig Start Date End Date Taking? Authorizing Provider  Ascorbic Acid (VITAMIN C) 500 MG CHEW Chew 1 tablet by mouth daily.    [provider]  azelastine (ASTELIN) 0.1 % nasal spray azelastine 137 mcg (0.1 %) nasal spray aerosol  TAKE 2 SPRAY(S) INTRANASALLY 2 TIMES A DAY AS NEEDED FOR NASAL CONGESTION    [provider]  B Complex Vitamins (VITAMIN B COMPLEX) TABS Take 1 tablet by mouth daily.    [provider]  Calcium Carb-Cholecalciferol (CALCIUM 1000 + D PO) Take by mouth.    [provider]  Cholecalciferol (VITAMIN D3) 1.25 MG (50000 UT) CAPS Vitamin D3    [provider]  cyclobenzaprine  (FLEXERIL ) 5 MG tablet     [provider]  ELDERBERRY PO Take by mouth.    [provider]  fexofenadine (ALLEGRA) 180 MG tablet Take 180 mg by mouth daily.    [provider]  montelukast (SINGULAIR) 10 MG tablet Take 10 mg by mouth at bedtime.  [provider]  Multiple Vitamins-Minerals (HAIR/SKIN/NAILS/BIOTIN PO) Hair,Skin and Nails 12/24/20   [provider]  pantoprazole (PROTONIX) 40 MG tablet Take 40 mg by mouth daily.    [provider]  vitamin B-12 (CYANOCOBALAMIN) 100 MCG tablet Vitamin B12    [provider]  SERTRALINE HCL PO Take by mouth.  08/11/20  [provider]    Family History Family History  Problem Relation Age of Onset   Colon cancer Mother 40   Hypertension Father    Stomach cancer Maternal Uncle    Colon polyps Neg Hx    Esophageal cancer Neg Hx    Rectal cancer Neg Hx     Social History Social History   Tobacco Use   Smoking status: Never   Smokeless tobacco: Never  Vaping Use   Vaping  status: Never Used  Substance Use Topics   Alcohol use: No   Drug use: No     Allergies   Other, Red dye #40 (allura red), and Sulfa antibiotics   Review of Systems Review of Systems  Constitutional:  Negative for chills and fever.  HENT:  Negative for ear pain and sore throat.   Eyes:  Negative for pain and visual disturbance.  Respiratory:  Negative for cough and shortness of breath.   Cardiovascular:  Negative for chest pain and palpitations.  Gastrointestinal:  Positive for abdominal pain, nausea and vomiting. Negative for blood in stool, constipation and diarrhea.  Genitourinary:  Negative for dysuria and hematuria.  Musculoskeletal:  Negative for arthralgias and back pain.  Skin:  Negative for color change and rash.  Neurological:  Negative for seizures and syncope.  All other systems reviewed and are negative.    Physical Exam Triage Vital Signs ED Triage Vitals [08/26/24 1347]  Encounter Vitals Group     BP 121/84     Girls Systolic BP Percentile      Girls Diastolic BP Percentile      Boys Systolic BP Percentile      Boys Diastolic BP Percentile      Pulse Rate 66     Resp 16     Temp 98.5 F (36.9 C)     Temp Source Oral     SpO2 100 %     Weight      Height      Head Circumference      Peak Flow      Pain Score 9     Pain Loc      Pain Education      Exclude from Growth Chart    No data found.  Updated Vital Signs BP 121/84 (BP Location: Right Arm)   Pulse 66   Temp 98.5 F (36.9 C) (Oral)   Resp 16   SpO2 100%   Visual Acuity Right Eye Distance:   Left Eye Distance:   Bilateral Distance:    Right Eye Near:   Left Eye Near:    Bilateral Near:     Physical Exam Vitals and nursing note reviewed.  Constitutional:      General: She is not in acute distress.    Appearance: She is well-developed.  HENT:     Head: Normocephalic and atraumatic.  Eyes:     Conjunctiva/sclera: Conjunctivae normal.  Cardiovascular:     Rate and Rhythm:  Normal rate and regular rhythm.     Heart sounds: No murmur heard. Pulmonary:     Effort: Pulmonary effort is normal. No respiratory distress.  Breath sounds: Normal breath sounds.  Abdominal:     General: Bowel sounds are normal. There is no distension.     Palpations: Abdomen is soft. There is no hepatomegaly or mass.     Tenderness: There is no abdominal tenderness.      Comments: Able to palpate the abdomen fairly aggressively with minimal response  Musculoskeletal:        General: No swelling.     Cervical back: Neck supple.  Skin:    General: Skin is warm and dry.     Capillary Refill: Capillary refill takes less than 2 seconds.  Neurological:     Mental Status: She is alert.  Psychiatric:        Mood and Affect: Mood normal.      UC Treatments / Results  Labs (all labs ordered are listed, but only abnormal results are displayed) Labs Reviewed  POCT URINALYSIS DIP (MANUAL ENTRY) - Abnormal; Notable for the following components:      Result Value   Clarity, UA cloudy (*)    Spec Grav, UA >=1.030 (*)    All other components within normal limits  CBC  COMPREHENSIVE METABOLIC PANEL WITH GFR  LIPASE, BLOOD    EKG   Radiology DG Abd 2 Views Result Date: 08/26/2024 EXAM: 2 VIEW XRAY OF THE ABDOMEN 08/26/2024 02:28:14 PM COMPARISON: None available. CLINICAL HISTORY: left flank/abdominal pain. Patient here today with c/o LLQ abd pain that started today all of a sudden. Pain radiates around to low back. FINDINGS: BOWEL: Nonobstructive bowel gas pattern. Paucity of bowel gas somewhat limits evaluation. No dilated loops of large or small bowel. Mild stool burden noted within the cecum. SOFT TISSUES: No opaque urinary calculi. BONES: Mild thoracolumbar S-shaped scoliosis. No acute osseous abnormality. IMPRESSION: 1. No bowel obstruction; evaluation limited by paucity of bowel gas. 2. Mild stool burden in the cecum. Electronically signed by: Waddell Calk MD 08/26/2024 02:57 PM  EDT RP Workstation: [id number]    Procedures Procedures (including critical care time)  Medications Ordered in UC Medications - No data to display  Initial Impression / Assessment and Plan / UC Course  I have reviewed the triage vital signs and the nursing notes.  Pertinent labs & imaging results that were available during my care of the patient were reviewed by me and considered in my medical decision making (see chart for details).     Left flank pain - Plan: POC urinalysis dipstick, DG Abd 2 Views, POC urinalysis dipstick, DG Abd 2 Views   Left flank pain - Plan: POC urinalysis dipstick, DG Abd 2 Views, POC urinalysis dipstick, DG Abd 2 Views   Urinalysis done today shows some mild dehydration but is otherwise normal.  X-ray of the abdomen done today does show some mild constipation as well as bowel gas.  We have drawn labs today including a complete metabolic panel, complete blood count and lipase.  Vital signs and physical exam findings are reassuring as is the slight improvement in abdominal pain.  At this time recommend monitoring symptoms at home and awaiting finalization of the lab work which could take up to 24 hours.  If there are any significant abnormalities we will contact you and likely recommend following up in the emergency room.  If your symptoms worsen at any time then recommend going to the emergency room immediately for further evaluation as we are limited on our imaging abilities here at urgent care.  Recommend staying hydrated and drinking plenty of fluids.  Final Clinical Impressions(s) / UC Diagnoses   Final diagnoses:  Left flank pain     Discharge Instructions      Urinalysis done today shows some mild dehydration but is otherwise normal.  X-ray of the abdomen done today does show some mild constipation as well as bowel gas.  We have drawn labs today including a complete metabolic panel, complete blood count and lipase.  Vital signs and physical exam  findings are reassuring as is the slight improvement in abdominal pain.  At this time recommend monitoring symptoms at home and awaiting finalization of the lab work which could take up to 24 hours.  If there are any significant abnormalities we will contact you and likely recommend following up in the emergency room.  If your symptoms worsen at any time then recommend going to the emergency room immediately for further evaluation as we are limited on our imaging abilities here at urgent care.  Recommend staying hydrated and drinking plenty of fluids.     ED Prescriptions   None    PDMP not reviewed this encounter.   Teresa Almarie LABOR, [place] 08/26/24 1502

## 2024-08-26 NOTE — ED Triage Notes (Signed)
 Patient here today with c/o LLQ abd pain that started today all of a sudden. Pain radiates around to low back.

## 2024-11-06 ENCOUNTER — Ambulatory Visit (INDEPENDENT_AMBULATORY_CARE_PROVIDER_SITE_OTHER)

## 2024-11-06 ENCOUNTER — Ambulatory Visit (HOSPITAL_COMMUNITY)
Admission: RE | Admit: 2024-11-06 | Discharge: 2024-11-06 | Disposition: A | Discharge status: Home / Self Care | Source: Ambulatory Visit | Attending: Family Medicine | Admitting: Family Medicine

## 2024-11-06 ENCOUNTER — Encounter (HOSPITAL_COMMUNITY): Payer: Self-pay

## 2024-11-06 VITALS — BP 120/85 | HR 74 | Temp 98.5°F | Resp 14

## 2024-11-06 DIAGNOSIS — R053 Chronic cough: Secondary | ICD-10-CM | POA: Diagnosis not present

## 2024-11-06 DIAGNOSIS — R0981 Nasal congestion: Secondary | ICD-10-CM

## 2024-11-06 MED ORDER — AMOXICILLIN-POT CLAVULANATE 875-125 MG PO TABS: 1.0000 | ORAL_TABLET | Freq: Two times a day (BID) | ORAL | 0 refills | Status: AC

## 2024-11-06 MED ORDER — HYDROCODONE BIT-HOMATROP MBR 5-1.5 MG/5ML PO SOLN: 5.0000 mL | Freq: Four times a day (QID) | ORAL | 0 refills | Status: AC | PRN

## 2024-11-06 MED ORDER — BENZONATATE 100 MG PO CAPS: ORAL_CAPSULE | ORAL | 0 refills | Status: AC

## 2024-11-06 NOTE — ED Provider Notes (Signed)
 [facility]   [id number] 11/06/24 Arrival Time: 9180  ASSESSMENT & PLAN:  1. Persistent cough   2. Sinus congestion    Given duration of symptoms, begin: Meds ordered this encounter  Medications   amoxicillin -clavulanate (AUGMENTIN ) 875-125 MG tablet    Sig: Take 1 tablet by mouth every 12 (twelve) hours.    Dispense:  14 tablet    Refill:  0   benzonatate  (TESSALON ) 100 MG capsule    Sig: Take 1 capsule by mouth every 8 (eight) hours for cough.    Dispense:  21 capsule    Refill:  0   I have personally viewed and independently interpreted the imaging studies ordered this visit. CXR: no acute changes.  Discussed typical duration of symptoms. OTC symptom care as needed. Ensure adequate fluid intake and rest.   Follow-up Information     Pahwani, Velna SAUNDERS, MD.   Specialty: Internal Medicine Why: As needed. Contact information: [address] [place] [address] [phone]                 Reviewed expectations re: course of current medical issues. Questions answered. Outlined signs and symptoms indicating need for more acute intervention. Patient verbalized understanding. After Visit Summary given.   SUBJECTIVE: History from: patient.  Renee Webb is a 58 y.o. female who presents with complaint of nasal congestion, post-nasal drainage. Onset gradual, 2 weeks ago. Respiratory symptoms: persistent cough. Fever: absent. Overall normal PO intake without n/v. OTC treatment: various cough meds without relief. Seasonal allergies: denies. History of frequent sinus infections: no. No specific aggravating or alleviating factors reported. Social History   Tobacco Use  Smoking Status Never  Smokeless Tobacco Never    ROS: As per HPI.  OBJECTIVE:  Vitals:   11/06/24 0836  BP: 120/85  Pulse: 74  Resp: 14  Temp: 98.5 F (36.9 C)  TempSrc: Oral  SpO2: 95%     General appearance: alert; no distress HEENT: nasal  congestion; clear runny nose; throat irritation secondary to post-nasal drainage; bilateral maxillary tenderness to palpation; turbinates boggy Neck: supple without LAD; trachea midline Lungs: unlabored respirations, symmetrical air entry; cough: mild; no respiratory distress Skin: warm and dry Psychological: alert and cooperative; normal mood and affect  Allergies  Allergen Reactions   Other Other (See Comments)    dyes in Triaminic=face swelling   Red Dye #40 (Allura Red) Other (See Comments)   Sulfa Antibiotics     Past Medical History:  Diagnosis Date   Allergy    Anemia    resolved- most recent CBC 06/2018 showed no anemia   GERD (gastroesophageal reflux disease)    Heart murmur    Family History  Problem Relation Age of Onset   Colon cancer Mother 56   Hypertension Father    Stomach cancer Maternal Uncle    Colon polyps Neg Hx    Esophageal cancer Neg Hx    Rectal cancer Neg Hx    Social History   Socioeconomic History   Marital status: Widowed    Spouse name: Not on file   Number of children: Not on file   Years of education: Not on file   Highest education level: Not on file  Occupational History   Not on file  Tobacco Use   Smoking status: Never   Smokeless tobacco: Never  Vaping Use   Vaping status: Never Used  Substance and Sexual Activity   Alcohol use: No   Drug use: No  Sexual activity: Yes    Birth control/protection: None  Other Topics Concern   Not on file  Social History Narrative   Not on file   Social Drivers of Health   Financial Resource Strain: Not on file  Food Insecurity: Not on file  Transportation Needs: Not on file  Physical Activity: Not on file  Stress: Not on file  Social Connections: Not on file  Intimate Partner Violence: Not on file             Rolinda Rogue, MD 11/06/24 [phone]

## 2024-11-06 NOTE — ED Triage Notes (Signed)
 Patient reports that she has had nasal congestion since Thanksgiving which causes her to cough. Patient states she has clear to yellow nasal mucus with tinges of blood.  Patient states she has taken Mucinex, Delsym, and Robitussin for her symptoms.
